# Patient Record
Sex: Female | Born: 1965 | Race: Black or African American | Hispanic: No | Marital: Single | Smoking: Never smoker
Health system: Southern US, Community
[De-identification: ages and names within clinical notes are randomized; demographics above are authoritative.]

## PROBLEM LIST (undated history)

## (undated) DIAGNOSIS — J45909 Unspecified asthma, uncomplicated: Secondary | ICD-10-CM

## (undated) DIAGNOSIS — E7800 Pure hypercholesterolemia, unspecified: Secondary | ICD-10-CM

## (undated) DIAGNOSIS — E669 Obesity, unspecified: Secondary | ICD-10-CM

## (undated) DIAGNOSIS — E119 Type 2 diabetes mellitus without complications: Secondary | ICD-10-CM

## (undated) DIAGNOSIS — I10 Essential (primary) hypertension: Secondary | ICD-10-CM

## (undated) HISTORY — PX: IR REMOVAL OF CALCULI/DEBRIS BILIARY DUCT/GB: IMG6054

---

## 2011-12-21 ENCOUNTER — Other Ambulatory Visit: Payer: Self-pay | Admitting: Internal Medicine

## 2011-12-21 DIAGNOSIS — Z1231 Encounter for screening mammogram for malignant neoplasm of breast: Secondary | ICD-10-CM

## 2012-02-01 ENCOUNTER — Ambulatory Visit
Admission: RE | Admit: 2012-02-01 | Discharge: 2012-02-01 | Disposition: A | Discharge status: Home / Self Care | Payer: Medicaid Other | Source: Ambulatory Visit | Attending: Internal Medicine | Admitting: Internal Medicine

## 2012-02-01 DIAGNOSIS — Z1231 Encounter for screening mammogram for malignant neoplasm of breast: Secondary | ICD-10-CM

## 2012-04-12 ENCOUNTER — Encounter: Payer: Self-pay | Admitting: Obstetrics

## 2012-06-20 ENCOUNTER — Ambulatory Visit: Payer: Self-pay | Admitting: Obstetrics

## 2012-06-25 ENCOUNTER — Emergency Department (HOSPITAL_COMMUNITY)
Admission: EM | Admit: 2012-06-25 | Discharge: 2012-06-26 | Disposition: A | Discharge status: Home / Self Care | Payer: Medicaid Other | Attending: Emergency Medicine | Admitting: Emergency Medicine

## 2012-06-25 ENCOUNTER — Emergency Department (HOSPITAL_COMMUNITY): Payer: Medicaid Other

## 2012-06-25 ENCOUNTER — Encounter (HOSPITAL_COMMUNITY): Payer: Self-pay | Admitting: Emergency Medicine

## 2012-06-25 DIAGNOSIS — J45909 Unspecified asthma, uncomplicated: Secondary | ICD-10-CM | POA: Insufficient documentation

## 2012-06-25 DIAGNOSIS — E7800 Pure hypercholesterolemia, unspecified: Secondary | ICD-10-CM | POA: Insufficient documentation

## 2012-06-25 DIAGNOSIS — E119 Type 2 diabetes mellitus without complications: Secondary | ICD-10-CM | POA: Insufficient documentation

## 2012-06-25 DIAGNOSIS — E669 Obesity, unspecified: Secondary | ICD-10-CM | POA: Insufficient documentation

## 2012-06-25 DIAGNOSIS — J45901 Unspecified asthma with (acute) exacerbation: Secondary | ICD-10-CM | POA: Insufficient documentation

## 2012-06-25 DIAGNOSIS — R079 Chest pain, unspecified: Secondary | ICD-10-CM

## 2012-06-25 DIAGNOSIS — I10 Essential (primary) hypertension: Secondary | ICD-10-CM | POA: Insufficient documentation

## 2012-06-25 HISTORY — DX: Obesity, unspecified: E66.9

## 2012-06-25 HISTORY — DX: Type 2 diabetes mellitus without complications: E11.9

## 2012-06-25 HISTORY — DX: Essential (primary) hypertension: I10

## 2012-06-25 HISTORY — DX: Pure hypercholesterolemia, unspecified: E78.00

## 2012-06-25 HISTORY — DX: Unspecified asthma, uncomplicated: J45.909

## 2012-06-25 LAB — BASIC METABOLIC PANEL
BUN: 15 mg/dL (ref 6–23)
Calcium: 9.7 mg/dL (ref 8.4–10.5)
Chloride: 104 mEq/L (ref 96–112)
Creatinine, Ser: 0.93 mg/dL (ref 0.50–1.10)
GFR calc Af Amer: 83 mL/min — ABNORMAL LOW (ref >90)

## 2012-06-25 LAB — CBC
HCT: 36.9 % (ref 36.0–46.0)
MCH: 25.8 pg — ABNORMAL LOW (ref 26.0–34.0)
MCHC: 32.5 g/dL (ref 30.0–36.0)
MCV: 79.2 fL (ref 78.0–100.0)
RDW: 13.6 % (ref 11.5–15.5)
WBC: 8.3 10*3/uL (ref 4.0–10.5)

## 2012-06-25 MED ORDER — ALBUTEROL SULFATE (5 MG/ML) 0.5% IN NEBU
5.0000 mg | INHALATION_SOLUTION | Freq: Once | RESPIRATORY_TRACT | Status: AC
  Administered 2012-06-25: 5 mg via RESPIRATORY_TRACT
  Filled 2012-06-25: qty 1

## 2012-06-25 MED ORDER — IPRATROPIUM BROMIDE 0.02 % IN SOLN
0.5000 mg | Freq: Once | RESPIRATORY_TRACT | Status: AC
  Administered 2012-06-25: 0.5 mg via RESPIRATORY_TRACT
  Filled 2012-06-25: qty 2.5

## 2012-06-25 MED ORDER — ACETAMINOPHEN 500 MG PO TABS
1000.0000 mg | ORAL_TABLET | Freq: Once | ORAL | Status: AC
  Administered 2012-06-25: 1000 mg via ORAL
  Filled 2012-06-25: qty 2

## 2012-06-25 MED ORDER — NITROGLYCERIN 0.4 MG SL SUBL
0.4000 mg | SUBLINGUAL_TABLET | SUBLINGUAL | Status: DC | PRN
  Administered 2012-06-25 (×2): 0.4 mg via SUBLINGUAL
  Filled 2012-06-25: qty 25

## 2012-06-25 MED ORDER — ASPIRIN 325 MG PO TABS
325.0000 mg | ORAL_TABLET | ORAL | Status: AC
  Administered 2012-06-25: 325 mg via ORAL
  Filled 2012-06-25: qty 1

## 2012-06-25 NOTE — ED Provider Notes (Signed)
History     CSN: [id number]  Arrival date & time 06/25/12  2137   First MD Initiated Contact with Patient 06/25/12 2303      Chief Complaint  Patient presents with  . Chest Pain    (Consider location/radiation/quality/duration/timing/severity/associated sxs/prior treatment) HPI This is a 47 year old female who reports a several day history of shortness of breath. This is worse with exertion. She has had chest pain since about 5 PM. The chest pain was sharp and central in her chest radiating to her back. The onset was at rest and, unlike her shortness of breath, did not change with exertion or rest. She describes the severity of her pain as "bad" at its worst and milder now. She was administered sublingual nitroglycerin earlier; that was equivocal change with the nitroglycerin but it did give her a headache. For shortness of breath has been associated with a cough. She is equivocal about whether the chest pain changes with deep breathing. She denies fever, nausea, vomiting, diarrhea or diaphoresis. She states she has an albuterol inhaler but it has not adequately relieved her shortness of breath.  Past Medical History  Diagnosis Date  . Diabetes mellitus without complication   . Hypertension   . Asthma   . Obesity   . Hypercholesterolemia     Past Surgical History  Procedure Laterality Date  . Cesarean section      No family history on file.  History  Substance Use Topics  . Smoking status: Never Smoker   . Smokeless tobacco: Not on file  . Alcohol Use: No    OB History   Grav Para Term Preterm Abortions TAB SAB Ect Mult Living                  Review of Systems  All other systems reviewed and are negative.    Allergies  Review of patient's allergies indicates no known allergies.  Home Medications  No current outpatient prescriptions on file.  BP 154/84  Pulse 77  Temp(Src) 98.2 F (36.8 C) (Oral)  Resp 18  SpO2 100%  LMP 05/22/2012  Physical  Exam General: Well-developed, well-nourished female in no acute distress; appearance consistent with age of record HENT: normocephalic, atraumatic Eyes: pupils equal round and reactive to light; extraocular muscles intact Neck: supple Heart: regular rate and rhythm; no murmurs, rubs or gallops Lungs: Decreased air movement bilaterally; shallow breaths; dry cough on attempted deep breathing Abdomen: soft; nondistended; nontender; no masses or hepatosplenomegaly; bowel sounds present Extremities: No deformity; full range of motion; pulses normal; no edema Neurologic: Awake, alert and oriented; motor function intact in all extremities and symmetric; no facial droop Skin: Warm and dry Psychiatric: Flat affect    ED Course  Procedures (including critical care time)    MDM   Nursing notes and vitals signs, including pulse oximetry, reviewed.  Summary of this visit's results, reviewed by myself:  Labs:  Results for orders placed during the hospital encounter of 06/25/12 (from the past 24 hour(s))  CBC     Status: Abnormal   Collection Time    06/25/12  9:53 PM      Result Value Range   WBC 8.3  4.0 - 10.5 K/uL   RBC 4.66  3.87 - 5.11 MIL/uL   Hemoglobin 12.0  12.0 - 15.0 g/dL   HCT 08.6  57.8 - 46.9 %   MCV 79.2  78.0 - 100.0 fL   MCH 25.8 (*) 26.0 - 34.0 pg   MCHC 32.5  30.0 - 36.0 g/dL   RDW 96.0  45.4 - 09.8 %   Platelets 340  150 - 400 K/uL  BASIC METABOLIC PANEL     Status: Abnormal   Collection Time    06/25/12  9:53 PM      Result Value Range   Sodium 139  135 - 145 mEq/L   Potassium 3.4 (*) 3.5 - 5.1 mEq/L   Chloride 104  96 - 112 mEq/L   CO2 26  19 - 32 mEq/L   Glucose, Bld 143 (*) 70 - 99 mg/dL   BUN 15  6 - 23 mg/dL   Creatinine, Ser 1.19  0.50 - 1.10 mg/dL   Calcium 9.7  8.4 - 14.7 mg/dL   GFR calc non Af Amer 72 (*) >90 mL/min   GFR calc Af Amer 83 (*) >90 mL/min  PRO B NATRIURETIC PEPTIDE     Status: None   Collection Time    06/25/12  9:53 PM       Result Value Range   Pro B Natriuretic peptide (BNP) 70.1  0 - 125 pg/mL  POCT I-STAT TROPONIN I     Status: None   Collection Time    06/25/12  9:58 PM      Result Value Range   Troponin i, poc 0.00  0.00 - 0.08 ng/mL   Comment 3           POCT I-STAT TROPONIN I     Status: None   Collection Time    06/26/12  1:15 AM      Result Value Range   Troponin i, poc 0.01  0.00 - 0.08 ng/mL   Comment 3           POCT I-STAT TROPONIN I     Status: None   Collection Time    06/26/12  4:49 AM      Result Value Range   Troponin i, poc 0.01  0.00 - 0.08 ng/mL   Comment 3             Imaging Studies: Dg Chest 2 View  06/25/2012   *RADIOLOGY REPORT*  Clinical Data: Chest pain and shortness of breath.  CHEST - 2 VIEW  Comparison: No priors.  Findings: Lung volumes are very low.  No consolidative airspace disease.  No pleural effusions.  No pneumothorax.  No pulmonary nodule or mass noted.  Pulmonary vasculature and the cardiomediastinal silhouette are within normal limits.  IMPRESSION: 1.  Very low lung volumes without radiographic evidence of acute cardiopulmonary disease.   Original Report Authenticated By: Trudie Reed, M.D.   EKG Interpretation:  Date & Time: 06/25/2012 9:45 PM  Rate: 82  Rhythm: normal sinus rhythm  QRS Axis: normal  Intervals: normal  ST/T Wave abnormalities: nonspecific ST/T changes  Conduction Disutrbances:none  Narrative Interpretation: [facility]  Old EKG Reviewed: none available  2:06 AM Patient is pain-free. Air movement improved with no wheezing after neb treatment. 2 troponins have been negative.  5:21 AM Patient is pain-free at this time. 3 troponins have been negative. She states her breathing continues to be improved, lungs clear.         Hanley Seamen, MD 06/26/12 [phone]

## 2012-06-25 NOTE — ED Notes (Signed)
PT. REPORTS MID CHEST PAIN RADIATING TO MID BACK ONSET TODAY WITH SOB AND DRY COUGH , DENIES NAUSEA OR DIAPHORESIS.

## 2012-06-26 LAB — POCT I-STAT TROPONIN I: Troponin i, poc: 0.01 ng/mL (ref 0.00–0.08)

## 2012-06-26 MED ORDER — ONDANSETRON 4 MG PO TBDP: 4.0000 mg | ORAL_TABLET | Freq: Once | ORAL | Status: DC

## 2012-06-26 MED ORDER — FENTANYL CITRATE 0.05 MG/ML IJ SOLN: 100.0000 ug | Freq: Once | INTRAMUSCULAR | Status: DC

## 2012-06-26 NOTE — ED Notes (Signed)
IV removed, cath in tact

## 2012-08-08 ENCOUNTER — Ambulatory Visit: Payer: Medicaid Other | Admitting: Obstetrics & Gynecology

## 2012-08-08 ENCOUNTER — Ambulatory Visit: Payer: Medicaid Other | Admitting: Obstetrics

## 2012-08-28 ENCOUNTER — Encounter (HOSPITAL_COMMUNITY): Payer: Self-pay | Admitting: *Deleted

## 2012-08-28 ENCOUNTER — Emergency Department (HOSPITAL_COMMUNITY)
Admission: EM | Admit: 2012-08-28 | Discharge: 2012-08-28 | Disposition: A | Discharge status: Home / Self Care | Payer: Medicaid Other | Attending: Emergency Medicine | Admitting: Emergency Medicine

## 2012-08-28 DIAGNOSIS — Z862 Personal history of diseases of the blood and blood-forming organs and certain disorders involving the immune mechanism: Secondary | ICD-10-CM | POA: Insufficient documentation

## 2012-08-28 DIAGNOSIS — J45909 Unspecified asthma, uncomplicated: Secondary | ICD-10-CM | POA: Insufficient documentation

## 2012-08-28 DIAGNOSIS — Z8639 Personal history of other endocrine, nutritional and metabolic disease: Secondary | ICD-10-CM | POA: Insufficient documentation

## 2012-08-28 DIAGNOSIS — E669 Obesity, unspecified: Secondary | ICD-10-CM | POA: Insufficient documentation

## 2012-08-28 DIAGNOSIS — I10 Essential (primary) hypertension: Secondary | ICD-10-CM | POA: Insufficient documentation

## 2012-08-28 DIAGNOSIS — M62838 Other muscle spasm: Secondary | ICD-10-CM | POA: Insufficient documentation

## 2012-08-28 DIAGNOSIS — M62830 Muscle spasm of back: Secondary | ICD-10-CM

## 2012-08-28 DIAGNOSIS — E119 Type 2 diabetes mellitus without complications: Secondary | ICD-10-CM | POA: Insufficient documentation

## 2012-08-28 MED ORDER — METHOCARBAMOL 500 MG PO TABS: 500.0000 mg | ORAL_TABLET | Freq: Two times a day (BID) | ORAL | Status: DC | PRN

## 2012-08-28 MED ORDER — METHOCARBAMOL 500 MG PO TABS
500.0000 mg | ORAL_TABLET | Freq: Once | ORAL | Status: AC
  Administered 2012-08-28: 500 mg via ORAL
  Filled 2012-08-28: qty 1

## 2012-08-28 NOTE — ED Notes (Signed)
BP results reported to PA.

## 2012-08-28 NOTE — ED Notes (Signed)
Pt reports lower back pain since Friday, unsure if she injured it when cleaning. No relief with ibuprofen and vicodin. Ambulatory at triage.

## 2012-08-28 NOTE — ED Notes (Signed)
Pt states she did "sit ups" on Thursday then did "heavy housework" on Friday.Lower back pain started on Friday.

## 2012-08-28 NOTE — ED Provider Notes (Signed)
Medical screening examination/treatment/procedure(s) were performed by non-physician practitioner and as supervising physician I was immediately available for consultation/collaboration.   Glynn Octave, MD 08/28/12 [phone]

## 2012-08-28 NOTE — ED Provider Notes (Signed)
CSN: [id number]     Arrival date & time 08/28/12  1340 History     None    Chief Complaint  Patient presents with  . Back Pain   (Consider location/radiation/quality/duration/timing/severity/associated sxs/prior Treatment) The history is provided by the patient and medical records.   Patient presents to the ED low back pain x5 days. Patient states last week she attempted to do sit-ups at home, and clean her entire house the next day.  Has had some "soreness" in her low back ever since, worse with bending forward and twisting motions.  No radiation into extremities.  Denies any numbness or paresthesias of lower extremities. No loss of bowel or bladder function. No prior history of back injury or back surgery. Has taken over-the-counter ibuprofen and Vicodin without significant relief.  No chest pain, SOB, or abdominal pain.  Pt hypertensive on arrival-- has been off her BP meds for several months.  Past Medical History  Diagnosis Date  . Diabetes mellitus without complication   . Hypertension   . Asthma   . Obesity   . Hypercholesterolemia    Past Surgical History  Procedure Laterality Date  . Cesarean section     History reviewed. No pertinent family history. History  Substance Use Topics  . Smoking status: Never Smoker   . Smokeless tobacco: Not on file  . Alcohol Use: No   OB History   Grav Para Term Preterm Abortions TAB SAB Ect Mult Living                 Review of Systems  Musculoskeletal: Positive for back pain.  All other systems reviewed and are negative.    Allergies  Review of patient's allergies indicates no known allergies.  Home Medications  No current outpatient prescriptions on file. BP 181/114  Temp(Src) 98.1 F (36.7 C) (Oral)  Resp 18  SpO2 95%  Physical Exam  Nursing note and vitals reviewed. Constitutional: She is oriented to person, place, and time. She appears well-developed and well-nourished. No distress.  HENT:  Head: Normocephalic  and atraumatic.  Eyes: Conjunctivae and EOM are normal. Pupils are equal, round, and reactive to light.  Neck: Normal range of motion. Neck supple.  Cardiovascular: Normal rate, regular rhythm and normal heart sounds.   Pulmonary/Chest: Effort normal and breath sounds normal. No respiratory distress. She has no wheezes.  Musculoskeletal: Normal range of motion.       Lumbar back: She exhibits tenderness, pain and spasm. She exhibits normal range of motion, no bony tenderness, no swelling, no edema, no deformity and no laceration.       Back:  TTP of lumbar paraspinal muscles bilaterally; no midline TTP, step-off, deformity, or visible signs of trauma; full ROM maintained; strong distal pulse,  sensation intact, normal gait unassisted  Neurological: She is alert and oriented to person, place, and time.  Skin: Skin is warm and dry. She is not diaphoretic.  Psychiatric: She has a normal mood and affect.    ED Course   Procedures (including critical care time)  Labs Reviewed - No data to display No results found.  1. Muscle spasm of back     MDM   Pt ambulated unassisted throughout the ED several times without difficulty.  Muscle spasms of LS bilaterally.  No concern for cauda equina or aortic dissection.  Rx robaxin.  Pt remains hypertensive-- instructed to FU with PCP to get back on her BP meds.  Discussed plan with pt, she agreed.  Return precautions  advised.  Garlon Hatchet, PA-C 08/28/12 1517

## 2012-09-11 ENCOUNTER — Ambulatory Visit (INDEPENDENT_AMBULATORY_CARE_PROVIDER_SITE_OTHER): Payer: Medicaid Other | Admitting: Obstetrics

## 2012-09-11 ENCOUNTER — Encounter: Payer: Self-pay | Admitting: Obstetrics

## 2012-09-11 VITALS — BP 130/99 | HR 94 | Temp 98.0°F | Wt 205.0 lb

## 2012-09-11 DIAGNOSIS — Z113 Encounter for screening for infections with a predominantly sexual mode of transmission: Secondary | ICD-10-CM

## 2012-09-11 DIAGNOSIS — Z0000 Encounter for general adult medical examination without abnormal findings: Secondary | ICD-10-CM

## 2012-09-11 NOTE — Addendum Note (Signed)
Addended by: Glendell Docker on: 09/11/2012 04:44 PM   Modules accepted: Orders

## 2012-09-11 NOTE — Progress Notes (Signed)
Subjective:     Leslie Wood is a 47 y.o. female here for a routine exam.  Current complaints: annual exam. No concerns at this time.  Personal health questionnaire reviewed: yes.   Gynecologic History No LMP recorded. Contraception: abstinence Last Pap: 2013 Results were: normal Last mammogram: 01/2012. Results were: normal  Obstetric History OB History  No data available     The following portions of the patient's history were reviewed and updated as appropriate: allergies, current medications, past family history, past medical history, past social history, past surgical history and problem list.  Review of Systems Pertinent items are noted in HPI.    Objective:    General appearance: alert and no distress Breasts: normal appearance, no masses or tenderness Abdomen: normal findings: soft, non-tender Pelvic: cervix normal in appearance, external genitalia normal, no adnexal masses or tenderness, no cervical motion tenderness, uterus normal size, shape, and consistency and vagina normal without discharge Extremities: extremities normal, atraumatic, no cyanosis or edema    Assessment:    Healthy female exam.    Plan:    Follow up in: 1 year.

## 2012-09-12 LAB — WET PREP BY MOLECULAR PROBE: Candida species: NEGATIVE

## 2012-09-12 LAB — GC/CHLAMYDIA PROBE AMP: CT Probe RNA: NEGATIVE

## 2012-09-12 LAB — PAP IG W/ RFLX HPV ASCU

## 2013-01-14 ENCOUNTER — Other Ambulatory Visit: Payer: Self-pay

## 2013-01-14 DIAGNOSIS — Z1231 Encounter for screening mammogram for malignant neoplasm of breast: Secondary | ICD-10-CM

## 2013-02-04 ENCOUNTER — Ambulatory Visit: Payer: Medicaid Other

## 2013-02-19 ENCOUNTER — Other Ambulatory Visit: Payer: Self-pay

## 2013-02-19 ENCOUNTER — Ambulatory Visit
Admission: RE | Admit: 2013-02-19 | Discharge: 2013-02-19 | Disposition: A | Discharge status: Home / Self Care | Payer: Medicaid Other | Source: Ambulatory Visit

## 2013-02-19 DIAGNOSIS — Z1231 Encounter for screening mammogram for malignant neoplasm of breast: Secondary | ICD-10-CM

## 2013-07-06 ENCOUNTER — Emergency Department (HOSPITAL_COMMUNITY)
Admission: EM | Admit: 2013-07-06 | Discharge: 2013-07-06 | Disposition: A | Discharge status: Home / Self Care | Payer: Medicaid Other | Attending: Emergency Medicine | Admitting: Emergency Medicine

## 2013-07-06 ENCOUNTER — Encounter (HOSPITAL_COMMUNITY): Payer: Self-pay | Admitting: Emergency Medicine

## 2013-07-06 DIAGNOSIS — J45909 Unspecified asthma, uncomplicated: Secondary | ICD-10-CM | POA: Insufficient documentation

## 2013-07-06 DIAGNOSIS — E669 Obesity, unspecified: Secondary | ICD-10-CM | POA: Insufficient documentation

## 2013-07-06 DIAGNOSIS — E7800 Pure hypercholesterolemia, unspecified: Secondary | ICD-10-CM | POA: Insufficient documentation

## 2013-07-06 DIAGNOSIS — Z79899 Other long term (current) drug therapy: Secondary | ICD-10-CM | POA: Insufficient documentation

## 2013-07-06 DIAGNOSIS — R112 Nausea with vomiting, unspecified: Secondary | ICD-10-CM | POA: Insufficient documentation

## 2013-07-06 DIAGNOSIS — I10 Essential (primary) hypertension: Secondary | ICD-10-CM | POA: Insufficient documentation

## 2013-07-06 DIAGNOSIS — E119 Type 2 diabetes mellitus without complications: Secondary | ICD-10-CM | POA: Insufficient documentation

## 2013-07-06 LAB — CBC WITH DIFFERENTIAL/PLATELET
BASOS PCT: 0 % (ref 0–1)
Basophils Absolute: 0 10*3/uL (ref 0.0–0.1)
EOS ABS: 0.2 10*3/uL (ref 0.0–0.7)
EOS PCT: 2 % (ref 0–5)
HCT: 37.4 % (ref 36.0–46.0)
Hemoglobin: 11.7 g/dL — ABNORMAL LOW (ref 12.0–15.0)
LYMPHS ABS: 3 10*3/uL (ref 0.7–4.0)
Lymphocytes Relative: 37 % (ref 12–46)
MCH: 25.9 pg — AB (ref 26.0–34.0)
MCHC: 31.3 g/dL (ref 30.0–36.0)
MCV: 82.7 fL (ref 78.0–100.0)
MONOS PCT: 8 % (ref 3–12)
Monocytes Absolute: 0.6 10*3/uL (ref 0.1–1.0)
Neutro Abs: 4.3 10*3/uL (ref 1.7–7.7)
Neutrophils Relative %: 53 % (ref 43–77)
Platelets: 285 10*3/uL (ref 150–400)
RBC: 4.52 MIL/uL (ref 3.87–5.11)
RDW: 13.9 % (ref 11.5–15.5)
WBC: 8.2 10*3/uL (ref 4.0–10.5)

## 2013-07-06 LAB — CBG MONITORING, ED
Glucose-Capillary: 189 mg/dL — ABNORMAL HIGH (ref 70–99)
Glucose-Capillary: 139 mg/dL — ABNORMAL HIGH (ref 70–99)

## 2013-07-06 LAB — COMPREHENSIVE METABOLIC PANEL
ALK PHOS: 93 U/L (ref 39–117)
ALT: 19 U/L (ref 0–35)
AST: 28 U/L (ref 0–37)
Albumin: 3.4 g/dL — ABNORMAL LOW (ref 3.5–5.2)
BUN: 18 mg/dL (ref 6–23)
CO2: 26 mEq/L (ref 19–32)
Calcium: 8.9 mg/dL (ref 8.4–10.5)
Chloride: 95 mEq/L — ABNORMAL LOW (ref 96–112)
Creatinine, Ser: 0.99 mg/dL (ref 0.50–1.10)
GFR calc non Af Amer: 66 mL/min — ABNORMAL LOW (ref >90)
GFR, EST AFRICAN AMERICAN: 76 mL/min — AB (ref >90)
GLUCOSE: 205 mg/dL — AB (ref 70–99)
Potassium: 3.8 mEq/L (ref 3.7–5.3)
Sodium: 136 mEq/L — ABNORMAL LOW (ref 137–147)
TOTAL PROTEIN: 7.8 g/dL (ref 6.0–8.3)
Total Bilirubin: <0.2 mg/dL — ABNORMAL LOW (ref 0.3–1.2)

## 2013-07-06 MED ORDER — OXYCODONE-ACETAMINOPHEN 5-325 MG PO TABS
1.0000 | ORAL_TABLET | Freq: Once | ORAL | Status: AC
  Administered 2013-07-06: 1 via ORAL
  Filled 2013-07-06: qty 1

## 2013-07-06 MED ORDER — ONDANSETRON HCL 4 MG PO TABS: 4.0000 mg | ORAL_TABLET | Freq: Four times a day (QID) | ORAL | Status: AC | PRN

## 2013-07-06 MED ORDER — SODIUM CHLORIDE 0.9 % IV SOLN
1000.0000 mL | Freq: Once | INTRAVENOUS | Status: AC
  Administered 2013-07-06: 1000 mL via INTRAVENOUS

## 2013-07-06 MED ORDER — ONDANSETRON HCL 4 MG/2ML IJ SOLN
4.0000 mg | Freq: Once | INTRAMUSCULAR | Status: AC
  Administered 2013-07-06: 4 mg via INTRAVENOUS
  Filled 2013-07-06: qty 2

## 2013-07-06 MED ORDER — ONDANSETRON 4 MG PO TBDP
8.0000 mg | ORAL_TABLET | Freq: Once | ORAL | Status: AC
  Administered 2013-07-06: 8 mg via ORAL

## 2013-07-06 MED ORDER — SODIUM CHLORIDE 0.9 % IV SOLN
1000.0000 mL | INTRAVENOUS | Status: DC
  Administered 2013-07-06: 1000 mL via INTRAVENOUS

## 2013-07-06 MED ORDER — ONDANSETRON 4 MG PO TBDP
ORAL_TABLET | ORAL | Status: AC
  Filled 2013-07-06: qty 2

## 2013-07-06 NOTE — ED Provider Notes (Signed)
CSN: [id number]     Arrival date & time 07/06/13  0049 History   First MD Initiated Contact with Patient 07/06/13 0222     Chief Complaint  Patient presents with  . Nausea     (Consider location/radiation/quality/duration/timing/severity/associated sxs/prior Treatment) The history is provided by the patient.   48 year old female who is diabetic comes in with a two-day history of nausea. She vomited once yesterday and once today. She denies fever, chills, sweats. She denies abdominal pain. She denies constipation or diarrhea. Symptoms are severe. Nothing makes it better nothing makes it worse. She's also noted that her blood sugars have been higher than normal. Highest blood sugar was 288. She has not taken any treatment for this at home. She denies any sick contacts.  Past Medical History  Diagnosis Date  . Diabetes mellitus without complication   . Hypertension   . Asthma   . Obesity   . Hypercholesterolemia    Past Surgical History  Procedure Laterality Date  . Cesarean section     No family history on file. History  Substance Use Topics  . Smoking status: Never Smoker   . Smokeless tobacco: Not on file  . Alcohol Use: No   OB History   Grav Para Term Preterm Abortions TAB SAB Ect Mult Living                 Review of Systems  All other systems reviewed and are negative.     Allergies  Review of patient's allergies indicates no known allergies.  Home Medications   Prior to Admission medications   Medication Sig Start Date End Date Taking? Authorizing Provider  glimepiride (AMARYL) 4 MG tablet Take 4 mg by mouth daily before breakfast.    Historical Provider, MD  hydrochlorothiazide (HYDRODIURIL) 25 MG tablet Take 25 mg by mouth daily.    Historical Provider, MD  Hydrocodone-Acetaminophen (VICODIN PO) Take 1 tablet by mouth daily as needed (pain).    Historical Provider, MD  ibuprofen (ADVIL,MOTRIN) 200 MG tablet Take 200 mg by mouth every 6 (six) hours as needed  for pain.    Historical Provider, MD  losartan (COZAAR) 100 MG tablet Take 100 mg by mouth daily.    Historical Provider, MD  methocarbamol (ROBAXIN) 500 MG tablet Take 1 tablet (500 mg total) by mouth 2 (two) times daily as needed. 08/28/12   Garlon HatchetLisa M Sanders, PA-C  pravastatin (PRAVACHOL) 20 MG tablet Take 20 mg by mouth daily.    Historical Provider, MD  Prenatal Vit-Fe Fumarate-FA (PRENATAL COMPLETE PO) Take 1 tablet by mouth daily.    Historical Provider, MD   BP 115/88  Pulse 116  Temp(Src) 98.6 F (37 C) (Oral)  Resp 20  Wt 214 lb (97.07 kg)  SpO2 94% Physical Exam  Nursing note and vitals reviewed.  48 year old female, resting comfortably and in no acute distress. Vital signs are significant for tachycardia with heart rate 116. Oxygen saturation is 94%, which is normal. Head is normocephalic and atraumatic. PERRLA, EOMI. Oropharynx is clear. Neck is nontender and supple without adenopathy or JVD. Back is nontender and there is no CVA tenderness. Lungs are clear without rales, wheezes, or rhonchi. Chest is nontender. Heart has regular rate and rhythm without murmur. Abdomen is soft, flat, nontender without masses or hepatosplenomegaly and peristalsis is normoactive. Extremities have no cyanosis or edema, full range of motion is present. Skin is warm and dry without rash. Neurologic: Mental status is normal, cranial nerves are intact,  there are no motor or sensory deficits.  ED Course  Procedures (including critical care time) Results for orders placed during the hospital encounter of 07/06/13  CBC WITH DIFFERENTIAL      Result Value Ref Range   WBC 8.2  4.0 - 10.5 K/uL   RBC 4.52  3.87 - 5.11 MIL/uL   Hemoglobin 11.7 (*) 12.0 - 15.0 g/dL   HCT 16.137.4  09.636.0 - 04.546.0 %   MCV 82.7  78.0 - 100.0 fL   MCH 25.9 (*) 26.0 - 34.0 pg   MCHC 31.3  30.0 - 36.0 g/dL   RDW 40.913.9  81.111.5 - 91.415.5 %   Platelets 285  150 - 400 K/uL   Neutrophils Relative % 53  43 - 77 %   Neutro Abs 4.3  1.7 -  7.7 K/uL   Lymphocytes Relative 37  12 - 46 %   Lymphs Abs 3.0  0.7 - 4.0 K/uL   Monocytes Relative 8  3 - 12 %   Monocytes Absolute 0.6  0.1 - 1.0 K/uL   Eosinophils Relative 2  0 - 5 %   Eosinophils Absolute 0.2  0.0 - 0.7 K/uL   Basophils Relative 0  0 - 1 %   Basophils Absolute 0.0  0.0 - 0.1 K/uL  COMPREHENSIVE METABOLIC PANEL      Result Value Ref Range   Sodium 136 (*) 137 - 147 mEq/L   Potassium 3.8  3.7 - 5.3 mEq/L   Chloride 95 (*) 96 - 112 mEq/L   CO2 26  19 - 32 mEq/L   Glucose, Bld 205 (*) 70 - 99 mg/dL   BUN 18  6 - 23 mg/dL   Creatinine, Ser 7.820.99  0.50 - 1.10 mg/dL   Calcium 8.9  8.4 - 95.610.5 mg/dL   Total Protein 7.8  6.0 - 8.3 g/dL   Albumin 3.4 (*) 3.5 - 5.2 g/dL   AST 28  0 - 37 U/L   ALT 19  0 - 35 U/L   Alkaline Phosphatase 93  39 - 117 U/L   Total Bilirubin <0.2 (*) 0.3 - 1.2 mg/dL   GFR calc non Af Amer 66 (*) >90 mL/min   GFR calc Af Amer 76 (*) >90 mL/min  CBG MONITORING, ED      Result Value Ref Range   Glucose-Capillary 189 (*) 70 - 99 mg/dL   MDM   Final diagnoses:  Non-intractable vomiting with nausea, vomiting of unspecified type    Nausea with intermittent vomiting. This seems most likely to be a viral gastritis. Tachycardia likely represents some degree of dehydration. She'll be given IV fluids and IV ondansetron. Screening labs are obtained.  She feels much better after above noted treatment. She is discharged with prescription for ondansetron.  Dione Boozeavid Glick, MD 07/06/13 [phone]

## 2013-07-06 NOTE — Discharge Instructions (Signed)
Nausea and Vomiting °Nausea is a sick feeling that often comes before throwing up (vomiting). Vomiting is a reflex where stomach contents come out of your mouth. Vomiting can cause severe loss of body fluids (dehydration). Children and elderly adults can become dehydrated quickly, especially if they also have diarrhea. Nausea and vomiting are symptoms of a condition or disease. It is important to find the cause of your symptoms. °CAUSES  °· Direct irritation of the stomach lining. This irritation can result from increased acid production (gastroesophageal reflux disease), infection, food poisoning, taking certain medicines (such as nonsteroidal anti-inflammatory drugs), alcohol use, or tobacco use. °· Signals from the brain. These signals could be caused by a headache, heat exposure, an inner ear disturbance, increased pressure in the brain from injury, infection, a tumor, or a concussion, pain, emotional stimulus, or metabolic problems. °· An obstruction in the gastrointestinal tract (bowel obstruction). °· Illnesses such as diabetes, hepatitis, gallbladder problems, appendicitis, kidney problems, cancer, sepsis, atypical symptoms of a heart attack, or eating disorders. °· Medical treatments such as chemotherapy and radiation. °· Receiving medicine that makes you sleep (general anesthetic) during surgery. °DIAGNOSIS °Your caregiver may ask for tests to be done if the problems do not improve after a few days. Tests may also be done if symptoms are severe or if the reason for the nausea and vomiting is not clear. Tests may include: °· Urine tests. °· Blood tests. °· Stool tests. °· Cultures (to look for evidence of infection). °· X-rays or other imaging studies. °Test results can help your caregiver make decisions about treatment or the need for additional tests. °TREATMENT °You need to stay well hydrated. Drink frequently but in small amounts. You may wish to drink water, sports drinks, clear broth, or eat frozen  ice pops or gelatin dessert to help stay hydrated. When you eat, eating slowly may help prevent nausea. There are also some antinausea medicines that may help prevent nausea. °HOME CARE INSTRUCTIONS  °· Take all medicine as directed by your caregiver. °· If you do not have an appetite, do not force yourself to eat. However, you must continue to drink fluids. °· If you have an appetite, eat a normal diet unless your caregiver tells you differently. °¨ Eat a variety of complex carbohydrates (rice, wheat, potatoes, bread), lean meats, yogurt, fruits, and vegetables. °¨ Avoid high-fat foods because they are more difficult to digest. °· Drink enough water and fluids to keep your urine clear or pale yellow. °· If you are dehydrated, ask your caregiver for specific rehydration instructions. Signs of dehydration may include: °¨ Severe thirst. °¨ Dry lips and mouth. °¨ Dizziness. °¨ Dark urine. °¨ Decreasing urine frequency and amount. °¨ Confusion. °¨ Rapid breathing or pulse. °SEEK IMMEDIATE MEDICAL CARE IF:  °· You have blood or brown flecks (like coffee grounds) in your vomit. °· You have black or bloody stools. °· You have a severe headache or stiff neck. °· You are confused. °· You have severe abdominal pain. °· You have chest pain or trouble breathing. °· You do not urinate at least once every 8 hours. °· You develop cold or clammy skin. °· You continue to vomit for longer than 24 to 48 hours. °· You have a fever. °MAKE SURE YOU:  °· Understand these instructions. °· Will watch your condition. °· Will get help right away if you are not doing well or get worse. °Document Released: 12/26/2004 Document Revised: 03/20/2011 Document Reviewed: 05/25/2010 °[employer]® Patient Information ©2015 [employer]. This information is not intended   to replace advice given to you by your health care provider. Make sure you discuss any questions you have with your health care provider. ° °Ondansetron tablets °What is this  medicine? °ONDANSETRON (on DAN se tron) is used to treat nausea and vomiting caused by chemotherapy. It is also used to prevent or treat nausea and vomiting after surgery. °This medicine may be used for other purposes; ask your health care provider or pharmacist if you have questions. °COMMON BRAND NAME(S): Zofran °What should I tell my health care provider before I take this medicine? °They need to know if you have any of these conditions: °-heart disease °-history of irregular heartbeat °-liver disease °-low levels of magnesium or potassium in the blood °-an unusual or allergic reaction to ondansetron, granisetron, other medicines, foods, dyes, or preservatives °-pregnant or trying to get pregnant °-breast-feeding °How should I use this medicine? °Take this medicine by mouth with a glass of water. Follow the directions on your prescription label. Take your doses at regular intervals. Do not take your medicine more often than directed. °Talk to your pediatrician regarding the use of this medicine in children. Special care may be needed. °Overdosage: If you think you have taken too much of this medicine contact a poison control center or emergency room at once. °NOTE: This medicine is only for you. Do not share this medicine with others. °What if I miss a dose? °If you miss a dose, take it as soon as you can. If it is almost time for your next dose, take only that dose. Do not take double or extra doses. °What may interact with this medicine? °Do not take this medicine with any of the following medications: °-apomorphine °-certain medicines for fungal infections like fluconazole, itraconazole, ketoconazole, posaconazole, voriconazole °-cisapride °-dofetilide °-dronedarone °-pimozide °-thioridazine °-ziprasidone °This medicine may also interact with the following medications: °-carbamazepine °-certain medicines for depression, anxiety, or psychotic disturbances °-fentanyl °-linezolid °-MAOIs like Carbex, Eldepryl,  Marplan, Nardil, and Parnate °-methylene blue (injected into a vein) °-other medicines that prolong the QT interval (cause an abnormal heart rhythm) °-phenytoin °-rifampicin °-tramadol °This list may not describe all possible interactions. Give your health care provider a list of all the medicines, herbs, non-prescription drugs, or dietary supplements you use. Also tell them if you smoke, drink alcohol, or use illegal drugs. Some items may interact with your medicine. °What should I watch for while using this medicine? °Check with your doctor or health care professional right away if you have any sign of an allergic reaction. °What side effects may I notice from receiving this medicine? °Side effects that you should report to your doctor or health care professional as soon as possible: °-allergic reactions like skin rash, itching or hives, swelling of the face, lips or tongue °-breathing problems °-confusion °-dizziness °-fast or irregular heartbeat °-feeling faint or lightheaded, falls °-fever and chills °-loss of balance or coordination °-seizures °-sweating °-swelling of the hands or feet °-tightness in the chest °-tremors °-unusually weak or tired °Side effects that usually do not require medical attention (report to your doctor or health care professional if they continue or are bothersome): °-constipation or diarrhea °-headache °This list may not describe all possible side effects. Call your doctor for medical advice about side effects. You may report side effects to FDA at 1-800-FDA-1088. °Where should I keep my medicine? °Keep out of the reach of children. °Store between 2 and 30 degrees C (36 and 86 degrees F). Throw away any unused medicine after the expiration   date. °NOTE: This sheet is a summary. It may not cover all possible information. If you have questions about this medicine, talk to your doctor, pharmacist, or health care provider. °© 2015, [employer]. (2012-10-02 16:27:45) ° °

## 2013-07-06 NOTE — ED Notes (Signed)
The pt is c/o n v  Since yesterday and she is also c/o a headache

## 2014-02-04 ENCOUNTER — Other Ambulatory Visit: Payer: Self-pay

## 2014-02-04 DIAGNOSIS — Z1231 Encounter for screening mammogram for malignant neoplasm of breast: Secondary | ICD-10-CM

## 2014-02-16 ENCOUNTER — Ambulatory Visit: Payer: Medicaid Other

## 2014-03-02 ENCOUNTER — Ambulatory Visit
Admission: RE | Admit: 2014-03-02 | Discharge: 2014-03-02 | Disposition: A | Discharge status: Home / Self Care | Payer: Medicaid Other | Source: Ambulatory Visit

## 2014-03-02 DIAGNOSIS — Z1231 Encounter for screening mammogram for malignant neoplasm of breast: Secondary | ICD-10-CM

## 2015-02-08 ENCOUNTER — Other Ambulatory Visit: Payer: Self-pay

## 2015-02-08 DIAGNOSIS — Z1231 Encounter for screening mammogram for malignant neoplasm of breast: Secondary | ICD-10-CM

## 2015-03-04 ENCOUNTER — Ambulatory Visit
Admission: RE | Admit: 2015-03-04 | Discharge: 2015-03-04 | Disposition: A | Discharge status: Home / Self Care | Payer: Medicaid Other | Source: Ambulatory Visit

## 2015-03-04 DIAGNOSIS — Z1231 Encounter for screening mammogram for malignant neoplasm of breast: Secondary | ICD-10-CM

## 2016-02-18 ENCOUNTER — Other Ambulatory Visit: Payer: Self-pay | Admitting: Internal Medicine

## 2016-02-18 DIAGNOSIS — Z1231 Encounter for screening mammogram for malignant neoplasm of breast: Secondary | ICD-10-CM

## 2016-03-07 ENCOUNTER — Ambulatory Visit
Admission: RE | Admit: 2016-03-07 | Discharge: 2016-03-07 | Disposition: A | Discharge status: Home / Self Care | Payer: Medicaid Other | Source: Ambulatory Visit | Attending: Internal Medicine | Admitting: Internal Medicine

## 2016-03-07 DIAGNOSIS — Z1231 Encounter for screening mammogram for malignant neoplasm of breast: Secondary | ICD-10-CM

## 2016-09-15 ENCOUNTER — Emergency Department (HOSPITAL_COMMUNITY): Admission: EM | Admit: 2016-09-15 | Discharge: 2016-09-15 | Discharge status: Against Medical Advice | Payer: Self-pay

## 2016-09-15 ENCOUNTER — Emergency Department (HOSPITAL_COMMUNITY): Payer: Medicaid Other

## 2016-09-15 ENCOUNTER — Encounter (HOSPITAL_COMMUNITY): Payer: Self-pay

## 2016-09-15 ENCOUNTER — Emergency Department (HOSPITAL_COMMUNITY)
Admission: EM | Admit: 2016-09-15 | Discharge: 2016-09-16 | Disposition: A | Discharge status: Home / Self Care | Payer: Medicaid Other | Attending: Emergency Medicine | Admitting: Emergency Medicine

## 2016-09-15 DIAGNOSIS — I671 Cerebral aneurysm, nonruptured: Secondary | ICD-10-CM | POA: Diagnosis not present

## 2016-09-15 DIAGNOSIS — Z79899 Other long term (current) drug therapy: Secondary | ICD-10-CM | POA: Insufficient documentation

## 2016-09-15 DIAGNOSIS — R631 Polydipsia: Secondary | ICD-10-CM | POA: Diagnosis not present

## 2016-09-15 DIAGNOSIS — I10 Essential (primary) hypertension: Secondary | ICD-10-CM | POA: Insufficient documentation

## 2016-09-15 DIAGNOSIS — J45909 Unspecified asthma, uncomplicated: Secondary | ICD-10-CM | POA: Insufficient documentation

## 2016-09-15 DIAGNOSIS — E119 Type 2 diabetes mellitus without complications: Secondary | ICD-10-CM | POA: Diagnosis not present

## 2016-09-15 DIAGNOSIS — R358 Other polyuria: Secondary | ICD-10-CM | POA: Diagnosis not present

## 2016-09-15 DIAGNOSIS — R51 Headache: Secondary | ICD-10-CM | POA: Insufficient documentation

## 2016-09-15 DIAGNOSIS — R519 Headache, unspecified: Secondary | ICD-10-CM

## 2016-09-15 LAB — POC URINE PREG, ED: Preg Test, Ur: NEGATIVE

## 2016-09-15 LAB — URINALYSIS, ROUTINE W REFLEX MICROSCOPIC
Bilirubin Urine: NEGATIVE
GLUCOSE, UA: NEGATIVE mg/dL
HGB URINE DIPSTICK: NEGATIVE
Ketones, ur: NEGATIVE mg/dL
LEUKOCYTES UA: NEGATIVE
Nitrite: NEGATIVE
PH: 5 (ref 5.0–8.0)
PROTEIN: NEGATIVE mg/dL
SPECIFIC GRAVITY, URINE: 1.017 (ref 1.005–1.030)

## 2016-09-15 LAB — CBC WITH DIFFERENTIAL/PLATELET
BASOS ABS: 0 10*3/uL (ref 0.0–0.1)
BASOS PCT: 0 %
Eosinophils Absolute: 0.2 10*3/uL (ref 0.0–0.7)
Eosinophils Relative: 2 %
HCT: 40.4 % (ref 36.0–46.0)
Hemoglobin: 13.4 g/dL (ref 12.0–15.0)
Lymphocytes Relative: 10 %
Lymphs Abs: 0.8 10*3/uL (ref 0.7–4.0)
MCH: 27 pg (ref 26.0–34.0)
MCHC: 33.2 g/dL (ref 30.0–36.0)
MCV: 81.5 fL (ref 78.0–100.0)
MONO ABS: 1.3 10*3/uL — AB (ref 0.1–1.0)
Monocytes Relative: 16 %
NEUTROS PCT: 72 %
Neutro Abs: 5.8 10*3/uL (ref 1.7–7.7)
Platelets: 288 10*3/uL (ref 150–400)
RBC: 4.96 MIL/uL (ref 3.87–5.11)
RDW: 13.3 % (ref 11.5–15.5)
WBC: 8 10*3/uL (ref 4.0–10.5)

## 2016-09-15 LAB — COMPREHENSIVE METABOLIC PANEL
ALBUMIN: 3.8 g/dL (ref 3.5–5.0)
ALT: 73 U/L — ABNORMAL HIGH (ref 14–54)
ANION GAP: 8 (ref 5–15)
AST: 68 U/L — ABNORMAL HIGH (ref 15–41)
Alkaline Phosphatase: 78 U/L (ref 38–126)
BUN: 24 mg/dL — ABNORMAL HIGH (ref 6–20)
CO2: 28 mmol/L (ref 22–32)
Calcium: 9.6 mg/dL (ref 8.9–10.3)
Chloride: 101 mmol/L (ref 101–111)
Creatinine, Ser: 0.99 mg/dL (ref 0.44–1.00)
GFR calc Af Amer: >60 mL/min (ref >60)
GFR calc non Af Amer: >60 mL/min (ref >60)
Glucose, Bld: 77 mg/dL (ref 65–99)
Potassium: 4 mmol/L (ref 3.5–5.1)
SODIUM: 137 mmol/L (ref 135–145)
Total Bilirubin: 0.5 mg/dL (ref 0.3–1.2)
Total Protein: 8.4 g/dL — ABNORMAL HIGH (ref 6.5–8.1)

## 2016-09-15 LAB — TROPONIN I: Troponin I: <0.03 ng/mL (ref <0.03)

## 2016-09-15 MED ORDER — KETOROLAC TROMETHAMINE 30 MG/ML IJ SOLN
30.0000 mg | Freq: Once | INTRAMUSCULAR | Status: AC
  Administered 2016-09-15: 30 mg via INTRAVENOUS
  Filled 2016-09-15: qty 1

## 2016-09-15 MED ORDER — MAGNESIUM SULFATE 2 GM/50ML IV SOLN
2.0000 g | Freq: Once | INTRAVENOUS | Status: AC
  Administered 2016-09-16: 2 g via INTRAVENOUS
  Filled 2016-09-15: qty 50

## 2016-09-15 MED ORDER — VALPROATE SODIUM 500 MG/5ML IV SOLN
1.0000 g | Freq: Once | INTRAVENOUS | Status: AC
  Administered 2016-09-16: 1000 mg via INTRAVENOUS
  Filled 2016-09-15: qty 10

## 2016-09-15 MED ORDER — SODIUM CHLORIDE 0.9 % IV BOLUS (SEPSIS)
1000.0000 mL | Freq: Once | INTRAVENOUS | Status: AC
  Administered 2016-09-15: 1000 mL via INTRAVENOUS

## 2016-09-15 MED ORDER — ACETAMINOPHEN 325 MG PO TABS
650.0000 mg | ORAL_TABLET | Freq: Once | ORAL | Status: AC
  Administered 2016-09-15: 650 mg via ORAL
  Filled 2016-09-15: qty 2

## 2016-09-15 MED ORDER — SODIUM CHLORIDE 0.9 % IV BOLUS (SEPSIS)
1000.0000 mL | Freq: Once | INTRAVENOUS | Status: AC
  Administered 2016-09-16: 1000 mL via INTRAVENOUS

## 2016-09-15 MED ORDER — METOCLOPRAMIDE HCL 5 MG/ML IJ SOLN
10.0000 mg | Freq: Once | INTRAMUSCULAR | Status: AC
  Administered 2016-09-15: 10 mg via INTRAVENOUS
  Filled 2016-09-15: qty 2

## 2016-09-15 MED ORDER — DIPHENHYDRAMINE HCL 50 MG/ML IJ SOLN
25.0000 mg | Freq: Once | INTRAMUSCULAR | Status: AC
  Administered 2016-09-15: 25 mg via INTRAVENOUS
  Filled 2016-09-15: qty 1

## 2016-09-15 NOTE — ED Triage Notes (Addendum)
Patient states she was at work and developed a headache with nausea approx 1300 today. Patient denies light or sound sensitivity. Patient states that she took Ibuprofen x 2 with very little relief.

## 2016-09-15 NOTE — ED Provider Notes (Signed)
[facility]-EMERGENCY DEPT Provider Note   CSN: [id number] Arrival date & time: 09/15/16  1641     History   Chief Complaint Chief Complaint  Patient presents with  . Headache  . Nausea    HPI Leslie Wood is a 51 y.o. female.  HPI   51 year old female with history of obesity, diabetes, hypertension, asthma presenting for evaluation of headache. Patient developed gradual onset of frontal headache that started earlier today and has been ongoing for the past 6 hours. She describes headache as a throbbing sensation across her forehead, with feeling nausea, and shaky. She had treated her symptoms to her diabetes. Headache is moderate in severity. She denies any associated fever, chills, diplopia, confusion, URI symptoms, chest pain, shortness of breath, abdominal pain, back pain, dysuria. She does report polyuria and polydipsia. She does have history of diabetes that is not well controlled, ran out of her insulin treatment  Past Medical History:  Diagnosis Date  . Asthma   . Diabetes mellitus without complication (HCC)   . Hypercholesterolemia   . Hypertension   . Obesity     There are no active problems to display for this patient.   Past Surgical History:  Procedure Laterality Date  . CESAREAN SECTION      OB History    No data available       Home Medications    Prior to Admission medications   Medication Sig Start Date End Date Taking? Authorizing Provider  glimepiride (AMARYL) 4 MG tablet Take 4 mg by mouth daily before breakfast.   Yes [provider]  ibuprofen (ADVIL,MOTRIN) 200 MG tablet Take 200 mg by mouth every 6 (six) hours as needed for pain.   Yes [provider]  losartan-hydrochlorothiazide Mauri Reading) 50-12.5 MG tablet  09/04/16  Yes [provider]  pravastatin (PRAVACHOL) 20 MG tablet Take 20 mg by mouth daily.   Yes [provider]  ondansetron (ZOFRAN) 4 MG tablet Take 1 tablet (4 mg total) by mouth every 6 (six) hours as  needed for nausea or vomiting. Patient not taking: Reported on 09/15/2016 07/06/13   Dione Booze, MD    Family History Family History  Problem Relation Age of Onset  . Hypertension Mother   . Diabetes Mother     Social History Social History  Substance Use Topics  . Smoking status: Never Smoker  . Smokeless tobacco: Never Used  . Alcohol use No     Allergies   Patient has no known allergies.   Review of Systems Review of Systems  All other systems reviewed and are negative.    Physical Exam Updated Vital Signs BP 113/84 (BP Location: Left Arm)   Pulse (!) 102   Temp 98.7 F (37.1 C) (Oral)   Resp 20   Wt 82 kg (180 lb 11.2 oz)   LMP 05/22/2012   SpO2 100%   Physical Exam  Constitutional: She is oriented to person, place, and time. She appears well-developed and well-nourished. No distress.  HENT:  Head: Atraumatic.  Mouth/Throat: Oropharynx is clear and moist.  Eyes: Conjunctivae are normal.  Neck: Normal range of motion. Neck supple.  No nuchal rigidity  Cardiovascular: Normal rate and regular rhythm.   Pulmonary/Chest: Effort normal and breath sounds normal.  Abdominal: Soft. Bowel sounds are normal. She exhibits no distension. There is no tenderness.  Neurological: She is alert and oriented to person, place, and time.  Neurologic exam:  Speech clear, pupils equal round reactive to light, extraocular movements intact  Normal peripheral visual fields Cranial nerves III through XII normal including no facial droop Follows commands, moves all extremities x4, normal strength to bilateral upper and lower extremities at all major muscle groups including grip Sensation normal to light touch Coordination intact, no limb ataxia, finger-nose-finger normal Rapid alternating movements normal No pronator drift Gait not tested   Skin: No rash noted.  Psychiatric: She has a normal mood and affect.  Nursing note and vitals reviewed.    ED Treatments / Results    Labs (all labs ordered are listed, but only abnormal results are displayed) Labs Reviewed - No data to display  EKG  EKG Interpretation None       Radiology No results found.  Procedures Procedures (including critical care time)  Medications Ordered in ED Medications - No data to display   Initial Impression / Assessment and Plan / ED Course  I have reviewed the triage vital signs and the nursing notes.  Pertinent labs & imaging results that were available during my care of the patient were reviewed by me and considered in my medical decision making (see chart for details).     BP 100/61   Pulse 99   Temp 98.7 F (37.1 C) (Oral)   Resp 16   Wt 82 kg (180 lb 11.2 oz)   LMP 05/22/2012   SpO2 95%    Final Clinical Impressions(s) / ED Diagnoses   Final diagnoses:  Bad headache    New Prescriptions New Prescriptions   No medications on file   6:56 PM Patient here with gradual onset of frontal headache. Also has history of diabetes that is not well treated. She has no focal neuro deficit on exam concerning for stroke or space occupying lesion. No fever or nuchal rigidity concerning for meningitis, and no acute onset thunderclap headache concerning for subarachnoid hemorrhage. Workup initiated, migraine cocktail given.  11:58 PM Pt report minimal improvement with migraine cocktail (toradol, benadryl, reglan)  A head CT was obtained showing no acute intracranial process.  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  Recommend MRI of the brain with contrast and sagitall T2 sequence.    I discuss this finding with oncall neurologist, Dr. Amada JupiterKirkpatrick who recommend obtaining head CT angiogram with contrast including venous phase to r/o venous sinus thrombosis.  If negative, pt can have brain MRI outpt at a later time.  Since pt does not report worsening headache with positional changes, we will continue treating her  headache with Depakene 1g and Mag Sulfate 2g via IV with IVF.  Care discussed with Dr. Rush Landmarkegeler.   12:23 AM Pt sign out to oncoming provider.  Pt will need result of head CTA.  outpt brain MRI if CT negative. Monitor for resolution of headache with depakene and mag sulfate.  Pt does have a PCP to f/u    Fayrene Helperran, Valon Glasscock, PA-C 09/16/16 0105    Tegeler, Canary Brimhristopher J, MD 09/21/16 1013

## 2016-09-16 ENCOUNTER — Emergency Department (HOSPITAL_COMMUNITY): Payer: Medicaid Other

## 2016-09-16 ENCOUNTER — Encounter (HOSPITAL_COMMUNITY): Payer: Self-pay | Admitting: Radiology

## 2016-09-16 MED ORDER — IOPAMIDOL (ISOVUE-370) INJECTION 76%: 100.0000 mL | Freq: Once | INTRAVENOUS | Status: DC | PRN

## 2016-09-16 MED ORDER — IOPAMIDOL (ISOVUE-370) INJECTION 76%
INTRAVENOUS | Status: AC
  Filled 2016-09-16: qty 100

## 2016-09-16 MED ORDER — IOPAMIDOL (ISOVUE-370) INJECTION 76%
80.0000 mL | Freq: Once | INTRAVENOUS | Status: AC | PRN
  Administered 2016-09-16: 80 mL via INTRAVENOUS

## 2016-09-16 NOTE — ED Notes (Signed)
Patient ambulated independently to restroom.

## 2016-09-16 NOTE — ED Notes (Signed)
Patient verbalized understanding of following up with PCP for MRI and neurologist.

## 2016-09-16 NOTE — Discharge Instructions (Signed)
The CT scans today do not show any evidence of emergent findings.  It is recommended that you follow-up with your doctor for an outpatient MRI.

## 2016-09-16 NOTE — ED Notes (Signed)
Neuro assessment performed with this RN

## 2016-09-16 NOTE — ED Notes (Signed)
Pt transported to CT ?

## 2016-09-16 NOTE — ED Provider Notes (Signed)
Patient pending CTA of head.   DC if normal and PCP, outpatient MRI.  3:58 AM CTA as below. Discussed the findings with Dr. Amada JupiterKirkpatrick, who recommends DC and outpatient follow-up.  Results for orders placed or performed during the hospital encounter of 09/15/16  CBC with Differential  Result Value Ref Range   WBC 8.0 4.0 - 10.5 K/uL   RBC 4.96 3.87 - 5.11 MIL/uL   Hemoglobin 13.4 12.0 - 15.0 g/dL   HCT 16.140.4 09.636.0 - 04.546.0 %   MCV 81.5 78.0 - 100.0 fL   MCH 27.0 26.0 - 34.0 pg   MCHC 33.2 30.0 - 36.0 g/dL   RDW 40.913.3 81.111.5 - 91.415.5 %   Platelets 288 150 - 400 K/uL   Neutrophils Relative % 72 %   Neutro Abs 5.8 1.7 - 7.7 K/uL   Lymphocytes Relative 10 %   Lymphs Abs 0.8 0.7 - 4.0 K/uL   Monocytes Relative 16 %   Monocytes Absolute 1.3 (H) 0.1 - 1.0 K/uL   Eosinophils Relative 2 %   Eosinophils Absolute 0.2 0.0 - 0.7 K/uL   Basophils Relative 0 %   Basophils Absolute 0.0 0.0 - 0.1 K/uL  Comprehensive metabolic panel  Result Value Ref Range   Sodium 137 135 - 145 mmol/L   Potassium 4.0 3.5 - 5.1 mmol/L   Chloride 101 101 - 111 mmol/L   CO2 28 22 - 32 mmol/L   Glucose, Bld 77 65 - 99 mg/dL   BUN 24 (H) 6 - 20 mg/dL   Creatinine, Ser 7.820.99 0.44 - 1.00 mg/dL   Calcium 9.6 8.9 - 95.610.3 mg/dL   Total Protein 8.4 (H) 6.5 - 8.1 g/dL   Albumin 3.8 3.5 - 5.0 g/dL   AST 68 (H) 15 - 41 U/L   ALT 73 (H) 14 - 54 U/L   Alkaline Phosphatase 78 38 - 126 U/L   Total Bilirubin 0.5 0.3 - 1.2 mg/dL   GFR calc non Af Amer >60 >60 mL/min   GFR calc Af Amer >60 >60 mL/min   Anion gap 8 5 - 15  Urinalysis, Routine w reflex microscopic  Result Value Ref Range   Color, Urine YELLOW YELLOW   APPearance CLEAR CLEAR   Specific Gravity, Urine 1.017 1.005 - 1.030   pH 5.0 5.0 - 8.0   Glucose, UA NEGATIVE NEGATIVE mg/dL   Hgb urine dipstick NEGATIVE NEGATIVE   Bilirubin Urine NEGATIVE NEGATIVE   Ketones, ur NEGATIVE NEGATIVE mg/dL   Protein, ur NEGATIVE NEGATIVE mg/dL   Nitrite NEGATIVE NEGATIVE    Leukocytes, UA NEGATIVE NEGATIVE  Troponin I  Result Value Ref Range   Troponin I <0.03 <0.03 ng/mL  [facility] preg, [facility]  Result Value Ref Range   Preg Test, Ur NEGATIVE NEGATIVE   Ct Angio Head W Or Wo Contrast  Result Date: 09/16/2016 CLINICAL DATA:  Acute onset moderate headache.  Nausea. EXAM: CT ANGIOGRAPHY HEAD AND NECK TECHNIQUE: Multidetector CT imaging of the head and neck was performed using the standard protocol during bolus administration of intravenous contrast. Multiplanar CT image reconstructions and MIPs were obtained to evaluate the vascular anatomy. Carotid stenosis measurements (when applicable) are obtained utilizing NASCET criteria, using the distal internal carotid diameter as the denominator. CONTRAST:  80 cc Isovue 370 COMPARISON:  CT HEAD September 15, 2016 FINDINGS: CTA NECK AORTIC ARCH: Normal appearance of the thoracic arch, 2 vessel arch is a normal variant. Mild calcific atherosclerosis. The origins of the innominate, left  Common carotid artery and subclavian artery are widely patent. RIGHT CAROTID SYSTEM: Common carotid artery is widely patent, coursing in a straight line fashion. Normal appearance of the carotid bifurcation without hemodynamically significant stenosis by NASCET criteria. Normal appearance of the included internal carotid artery. LEFT CAROTID SYSTEM: Common carotid artery is widely patent, coursing in a straight line fashion. Normal appearance of the carotid bifurcation without hemodynamically significant stenosis by NASCET criteria. Normal appearance of the included internal carotid artery. VERTEBRAL ARTERIES:Left vertebral artery is dominant. Normal appearance of the vertebral arteries, which appear widely patent. SKELETON: No acute osseous process though bone windows have not been submitted. OTHER NECK: Soft tissues of the neck are nonacute though, not tailored for evaluation. UPPER CHEST: Included lung apices are clear. Main pulmonary artery is  mildly enlarged at 3.1 cm seen with chronic pulmonary arterial hypertension. No superior mediastinal lymphadenopathy. CTA HEAD- delayed phase. ANTERIOR CIRCULATION: Patent cervical internal carotid arteries, petrous, cavernous and supra clinoid internal carotid arteries. 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 Widely patent anterior communicating artery. Patent anterior and middle cerebral arteries. No large vessel occlusion, significant stenosis, contrast extravasation. POSTERIOR CIRCULATION: Patent vertebral arteries, vertebrobasilar junction and basilar artery, as well as main branch vessels. Patent posterior cerebral arteries. Tiny bilateral posterior communicating artery is present. No large vessel occlusion, significant stenosis, contrast extravasation or aneurysm. VENOUS SINUSES: Major dural venous sinuses are patent though not tailored for evaluation on this angiographic examination. ANATOMIC VARIANTS: None. DELAYED PHASE: No abnormal intracranial enhancement. 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 MIP images reviewed. IMPRESSION: CTA NECK: 1. No hemodynamically significant stenosis or acute vascular process. CTA HEAD: 1. No emergent large vessel occlusion, stenosis or acute vascular process. 2. 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 Electronically Signed   By: Awilda Metro M.D.   On: 09/16/2016 02:44   Ct Head Wo Contrast  Result Date: 09/15/2016 CLINICAL DATA:  Headache and nausea beginning at work this afternoon. History of hypertension, hypercholesterolemia and diabetes. EXAM: CT HEAD WITHOUT CONTRAST TECHNIQUE: Contiguous axial images were obtained from the base of the skull through the vertex without intravenous contrast. COMPARISON:  None. FINDINGS: BRAIN: No intraparenchymal hemorrhage, mass effect nor midline shift. The ventricles and sulci are normal. No acute large vascular  territory infarcts. No abnormal extra-axial fluid collections. Narrowed prepontine cistern. 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 VASCULAR: Unremarkable. SKULL/SOFT TISSUES: No skull fracture. No significant soft tissue swelling. ORBITS/SINUSES: The included ocular globes and orbital contents are normal.The mastoid aircells and included paranasal sinuses are well-aerated. Under pneumatized mastoid air cells. OTHER: None. IMPRESSION: 1. No acute intracranial process. 2. 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 Recommend MRI of the brain with contrast and sagittal T2 sequence. Electronically Signed   By: Awilda Metro M.D.   On: 09/15/2016 23:11   Ct Angio Neck W Or Wo Contrast  Result Date: 09/16/2016 CLINICAL DATA:  Acute onset moderate headache.  Nausea. EXAM: CT ANGIOGRAPHY HEAD AND NECK TECHNIQUE: Multidetector CT imaging of the head and neck was performed using the standard protocol during bolus administration of intravenous contrast. Multiplanar CT image reconstructions and MIPs were obtained to evaluate the vascular anatomy. Carotid stenosis measurements (when applicable) are obtained utilizing NASCET criteria, using the distal internal carotid diameter as the denominator. CONTRAST:  80 cc Isovue 370 COMPARISON:  CT HEAD September 15, 2016 FINDINGS: CTA NECK AORTIC ARCH:  Normal appearance of the thoracic arch, 2 vessel arch is a normal variant. Mild calcific atherosclerosis. The origins of the innominate, left Common carotid artery and subclavian artery are widely patent. RIGHT CAROTID SYSTEM: Common carotid artery is widely patent, coursing in a straight line fashion. Normal appearance of the carotid bifurcation without hemodynamically significant stenosis by NASCET criteria. Normal appearance of the included internal carotid artery. LEFT CAROTID SYSTEM: Common carotid  artery is widely patent, coursing in a straight line fashion. Normal appearance of the carotid bifurcation without hemodynamically significant stenosis by NASCET criteria. Normal appearance of the included internal carotid artery. VERTEBRAL ARTERIES:Left vertebral artery is dominant. Normal appearance of the vertebral arteries, which appear widely patent. SKELETON: No acute osseous process though bone windows have not been submitted. OTHER NECK: Soft tissues of the neck are nonacute though, not tailored for evaluation. UPPER CHEST: Included lung apices are clear. Main pulmonary artery is mildly enlarged at 3.1 cm seen with chronic pulmonary arterial hypertension. No superior mediastinal lymphadenopathy. CTA HEAD- delayed phase. ANTERIOR CIRCULATION: Patent cervical internal carotid arteries, petrous, cavernous and supra clinoid internal carotid arteries. 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 Widely patent anterior communicating artery. Patent anterior and middle cerebral arteries. No large vessel occlusion, significant stenosis, contrast extravasation. POSTERIOR CIRCULATION: Patent vertebral arteries, vertebrobasilar junction and basilar artery, as well as main branch vessels. Patent posterior cerebral arteries. Tiny bilateral posterior communicating artery is present. No large vessel occlusion, significant stenosis, contrast extravasation or aneurysm. VENOUS SINUSES: Major dural venous sinuses are patent though not tailored for evaluation on this angiographic examination. ANATOMIC VARIANTS: None. DELAYED PHASE: No abnormal intracranial enhancement. 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 MIP images reviewed. IMPRESSION: CTA NECK: 1. No hemodynamically significant stenosis or acute vascular process. CTA HEAD: 1. No emergent large vessel occlusion, stenosis or acute vascular process. 2. 2 mm LEFT PCOM origin aneurysm, less  likely infundibulum. Electronically Signed   By: Awilda Metro M.D.   On: 09/16/2016 02:44      Roxy Horseman, PA-C 09/16/16 [id number]    Tegeler, Canary Brim, MD 09/21/16 1013

## 2016-09-17 ENCOUNTER — Encounter (HOSPITAL_COMMUNITY): Payer: Self-pay

## 2016-09-17 ENCOUNTER — Observation Stay (HOSPITAL_COMMUNITY)
Admission: EM | Admit: 2016-09-17 | Discharge: 2016-09-18 | Disposition: A | Discharge status: Home / Self Care | Payer: Medicaid Other | Attending: Internal Medicine | Admitting: Internal Medicine

## 2016-09-17 ENCOUNTER — Emergency Department (HOSPITAL_COMMUNITY): Payer: Medicaid Other

## 2016-09-17 DIAGNOSIS — R072 Precordial pain: Secondary | ICD-10-CM | POA: Diagnosis not present

## 2016-09-17 DIAGNOSIS — E7800 Pure hypercholesterolemia, unspecified: Secondary | ICD-10-CM | POA: Diagnosis not present

## 2016-09-17 DIAGNOSIS — I10 Essential (primary) hypertension: Secondary | ICD-10-CM | POA: Diagnosis present

## 2016-09-17 DIAGNOSIS — R079 Chest pain, unspecified: Secondary | ICD-10-CM

## 2016-09-17 DIAGNOSIS — E119 Type 2 diabetes mellitus without complications: Secondary | ICD-10-CM

## 2016-09-17 DIAGNOSIS — R0789 Other chest pain: Principal | ICD-10-CM | POA: Insufficient documentation

## 2016-09-17 DIAGNOSIS — J4520 Mild intermittent asthma, uncomplicated: Secondary | ICD-10-CM

## 2016-09-17 DIAGNOSIS — J45909 Unspecified asthma, uncomplicated: Secondary | ICD-10-CM | POA: Insufficient documentation

## 2016-09-17 DIAGNOSIS — Z794 Long term (current) use of insulin: Secondary | ICD-10-CM | POA: Diagnosis not present

## 2016-09-17 LAB — CBC
HCT: 39 % (ref 36.0–46.0)
HEMOGLOBIN: 12.4 g/dL (ref 12.0–15.0)
MCH: 26.5 pg (ref 26.0–34.0)
MCHC: 31.8 g/dL (ref 30.0–36.0)
MCV: 83.3 fL (ref 78.0–100.0)
Platelets: 271 10*3/uL (ref 150–400)
RBC: 4.68 MIL/uL (ref 3.87–5.11)
RDW: 13.4 % (ref 11.5–15.5)
WBC: 5.7 10*3/uL (ref 4.0–10.5)

## 2016-09-17 LAB — BASIC METABOLIC PANEL
Anion gap: 7 (ref 5–15)
BUN: 14 mg/dL (ref 6–20)
CALCIUM: 9 mg/dL (ref 8.9–10.3)
CHLORIDE: 104 mmol/L (ref 101–111)
CO2: 26 mmol/L (ref 22–32)
Creatinine, Ser: 0.84 mg/dL (ref 0.44–1.00)
Glucose, Bld: 88 mg/dL (ref 65–99)
Potassium: 3.5 mmol/L (ref 3.5–5.1)
SODIUM: 137 mmol/L (ref 135–145)

## 2016-09-17 LAB — GLUCOSE, CAPILLARY: GLUCOSE-CAPILLARY: 236 mg/dL — AB (ref 65–99)

## 2016-09-17 LAB — HEMOGLOBIN A1C
HEMOGLOBIN A1C: 11.1 % — AB (ref 4.8–5.6)
MEAN PLASMA GLUCOSE: 271.87 mg/dL

## 2016-09-17 LAB — I-STAT TROPONIN, ED: TROPONIN I, POC: 0 ng/mL (ref 0.00–0.08)

## 2016-09-17 LAB — TROPONIN I: Troponin I: <0.03 ng/mL (ref <0.03)

## 2016-09-17 MED ORDER — MORPHINE SULFATE (PF) 2 MG/ML IV SOLN: 2.0000 mg | INTRAVENOUS | Status: DC | PRN

## 2016-09-17 MED ORDER — ASPIRIN EC 325 MG PO TBEC
325.0000 mg | DELAYED_RELEASE_TABLET | Freq: Every day | ORAL | Status: DC
  Administered 2016-09-18: 325 mg via ORAL
  Filled 2016-09-17 (×2): qty 1

## 2016-09-17 MED ORDER — INSULIN GLARGINE 100 UNIT/ML ~~LOC~~ SOLN
15.0000 [IU] | Freq: Every day | SUBCUTANEOUS | Status: DC
  Filled 2016-09-17: qty 0.15

## 2016-09-17 MED ORDER — ENOXAPARIN SODIUM 40 MG/0.4ML ~~LOC~~ SOLN
40.0000 mg | SUBCUTANEOUS | Status: DC
  Administered 2016-09-17: 40 mg via SUBCUTANEOUS
  Filled 2016-09-17: qty 0.4

## 2016-09-17 MED ORDER — IBUPROFEN 200 MG PO TABS: 400.0000 mg | ORAL_TABLET | Freq: Four times a day (QID) | ORAL | Status: DC | PRN

## 2016-09-17 MED ORDER — GI COCKTAIL ~~LOC~~: 30.0000 mL | Freq: Four times a day (QID) | ORAL | Status: DC | PRN

## 2016-09-17 MED ORDER — INSULIN ASPART 100 UNIT/ML ~~LOC~~ SOLN: 0.0000 [IU] | Freq: Three times a day (TID) | SUBCUTANEOUS | Status: DC

## 2016-09-17 MED ORDER — ALBUTEROL SULFATE (2.5 MG/3ML) 0.083% IN NEBU: 2.5000 mg | INHALATION_SOLUTION | Freq: Four times a day (QID) | RESPIRATORY_TRACT | Status: DC | PRN

## 2016-09-17 MED ORDER — LOSARTAN POTASSIUM-HCTZ 50-12.5 MG PO TABS: 1.0000 | ORAL_TABLET | Freq: Every day | ORAL | Status: DC

## 2016-09-17 MED ORDER — ACETAMINOPHEN 500 MG PO TABS: 1000.0000 mg | ORAL_TABLET | Freq: Four times a day (QID) | ORAL | Status: DC | PRN

## 2016-09-17 MED ORDER — NITROGLYCERIN 0.4 MG SL SUBL: 0.4000 mg | SUBLINGUAL_TABLET | SUBLINGUAL | Status: DC | PRN

## 2016-09-17 MED ORDER — INSULIN ASPART 100 UNIT/ML ~~LOC~~ SOLN
0.0000 [IU] | Freq: Every day | SUBCUTANEOUS | Status: DC
  Administered 2016-09-17: 2 [IU] via SUBCUTANEOUS

## 2016-09-17 MED ORDER — PRAVASTATIN SODIUM 20 MG PO TABS
20.0000 mg | ORAL_TABLET | Freq: Every day | ORAL | Status: DC
  Administered 2016-09-17 – 2016-09-18 (×2): 20 mg via ORAL
  Filled 2016-09-17 (×2): qty 1

## 2016-09-17 MED ORDER — ONDANSETRON HCL 4 MG/2ML IJ SOLN
4.0000 mg | Freq: Four times a day (QID) | INTRAMUSCULAR | Status: DC | PRN
  Filled 2016-09-17: qty 2

## 2016-09-17 MED ORDER — HYDROCHLOROTHIAZIDE 12.5 MG PO CAPS
12.5000 mg | ORAL_CAPSULE | Freq: Every day | ORAL | Status: DC
  Filled 2016-09-17: qty 1

## 2016-09-17 MED ORDER — HYDRALAZINE HCL 20 MG/ML IJ SOLN
5.0000 mg | INTRAMUSCULAR | Status: DC | PRN
Start: 2016-09-17 — End: 2016-09-18

## 2016-09-17 MED ORDER — LOSARTAN POTASSIUM 50 MG PO TABS
50.0000 mg | ORAL_TABLET | Freq: Every day | ORAL | Status: DC
  Filled 2016-09-17: qty 1

## 2016-09-17 NOTE — H&P (Signed)
History and Physical   Leslie Wood ZOX:[id number] DOB: Jun 19, 1965 DOA: 09/17/2016  Referring MD/NP/PA: Conni Elliot, PA, EDP PCP: Fleet Contras, MD   Chief Complaint: Chest pain  HPI: Leslie Wood is a 51 y.o. female with a history of HTN, HLD, IDDM who presented to the ED 9/9 with sudden onset chest pain. She describes sitting at home when she noted sudden onset, severe chest "aching" in the middle of the chest radiating to the back which worsened when she laid supine, but not necessarily better when leaning forward. It remained constant, associated with dyspnea, so she called EMS who provided NTG and ASA en route with improvement from level 10/10 to 4/10. In the ED vital signs have been stable, work up including normal labs, negative CXR, undetectable troponin, and nonischemic ECG. Based on risk factors and presentation, chest pain observation was requested.    Recently seen in ED for severe headache with negative imaging work up which has resolved.   ROS: Denies fever, sick contacts, chills, weight loss, changes in vision or hearing, cough, sore throat, palpitations, abdominal pain, nausea, vomiting, changes in bowel habits, blood in stool, change in bladder habits, myalgias, arthralgias, and rash. All others reviewed and are negative.  PMH: IDDM, HLD, HTN, asthma Surgical Hx: C section SH: Never smoker, no EtOH. Lives with Son and Leslie Wood in [place]. No illicit drugs now or ever.  Allergies: NKA, but did not tolerate metformin due to GI issues. FH: HTN and DM in mom. No premature CAD.  Medication Sig  acetaminophen (TYLENOL) 500 MG tablet Take 1,000 mg by mouth every 6 (six) hours as needed for headache (pain).  albuterol (PROAIR HFA) 108 (90 Base) MCG/ACT inhaler Inhale 2 puffs into the lungs every 6 (six) hours as needed for wheezing or shortness of breath.  glimepiride (AMARYL) 4 MG tablet Take 4 mg by mouth daily before breakfast.  ibuprofen (ADVIL,MOTRIN) 200 MG tablet Take 400 mg by mouth every 6  (six) hours as needed for headache (pain).   Insulin Glargine (TOUJEO SOLOSTAR) 300 UNIT/ML SOPN Inject 30 Units into the skin daily.  losartan-hydrochlorothiazide (HYZAAR) 50-12.5 MG tablet Take 1 tablet by mouth daily.   pravastatin (PRAVACHOL) 20 MG tablet Take 20 mg by mouth daily.  ondansetron (ZOFRAN) 4 MG tablet Take 1 tablet (4 mg total) by mouth every 6 (six) hours as needed for nausea or vomiting. Patient not taking: Reported on 09/15/2016   Physical Exam: BP 120/62   Pulse 76   Temp 98 F (36.7 C) (Oral)   Resp 14   Ht  (1.651 m)   Wt 81.6 kg (180 lb)   LMP 05/22/2012 Comment: negative pregnancy test result 09-15-16  SpO2 97%   BMI 29.95 kg/m   Constitutional: 51 y.o. female in no distress, calm demeanor Eyes: Lids and conjunctivae normal, PERRL ENMT: Mucous membranes are moist. Posterior pharynx clear of any exudate or lesions. Fair dentition.  Neck: normal, supple, no masses, no thyromegaly Respiratory: Non-labored breathing room air without accessory muscle use. Clear breath sounds to auscultation bilaterally Cardiovascular: Regular rate and rhythm, narrow complexes on monitor, there are no murmurs, rubs, or gallops. No carotid bruits. No JVD. No LE edema. Radial, carotid and DP pulses are 2+ and symmetric. Abdomen: Normoactive bowel sounds. No tenderness, non-distended, and no masses palpated. No hepatosplenomegaly. GU: No indwelling catheter Musculoskeletal: No clubbing / cyanosis. No joint deformity upper and lower extremities. Good ROM, no contractures. Normal muscle tone.  Skin: Warm, dry. No rashes, wounds, no ulcers.  No significant lesions noted.  Neurologic: CN II-XII grossly intact. Gait not assessed. Speech normal. No focal deficits in motor strength or sensation in all extremities.  Psychiatric: Alert and oriented x3. Normal judgment and insight. Mood euthymic with congruent affect. Does not appears anxious.  CBC: wnl BMP: wnl Troponin: Neg CXR 2v: On my  interpretation, right hemidiaphragm elevation without infiltrate, effusions, PTX, or vascular congestion. Cardiac silhouette is not enlarged, mediastinum is not widened. Visualized skeleton wnl. EKG: Independently reviewed. NSR, normal axis, no ST segment deviations or T wave inversions, early RWP.   Assessment/Plan Atypical chest pain: Substernal and relieved by NTG, not necessarily exertional. HEART score 4, with many RF's and no prior work up. DDx includes aortic dissection with sudden chest pain radiating to the back, though pulses equal bilaterally, no hypotension, normal cardiac exam, CXR without widened mediastinum. PE considered unlikely without tachycardia or hypoxia or RF's. MSK pain is possible with worsening with movements.  - Chest pain observation: Cycle enzymes, repeat ECG in AM, NPO p MN pending cardiology evaluation.  - Morphine, NTG, O2 prn. ASA given.  - No stigmata of heart failure, so will defer echo  Essential HTN:  - Continue home antihypertensives ARB/HCTZ  IDT2DM:  - Glucose wnl on metabolic panel.  - Decrease basal insulin in AM to 20u + sensitive SSI, hold OSU - Check HbA1c  Hyperlipidemia: Chronic.  - Continue medium intensity statin pending repeat lipid panel.   Headache: Recently negative work up.  - Tylenol, ibuprofen prn.   Intermittent asthma, mild: Last MDI use was 2 months ago - Albuterol MDI prn  DVT prophylaxis: Lovenox  Code Status: Full confirmed at admission  Family Communication: Daughter at bedside Disposition Plan: Anticipate DC home pending cardiac work up CiscoConsults called: Cardiology moonlighter called by EDP. Stated NPO after MN pending formal consult in AM.   Admission status: Observation    Hazeline Junkeryan Czarina Gingras, MD [facility] Pager [phone]  If 7PM-7AM, please contact night-coverage [email or web address] Password [email or web address] 09/17/2016, 5:53 PM

## 2016-09-17 NOTE — ED Notes (Signed)
Dinner tray ordered; regular diet 

## 2016-09-17 NOTE — ED Triage Notes (Signed)
Pt BIB [facility] from home where pt reports sudden onset of central CP with radiation to back and left arm. 2 Nitro and 325 ASA given PTA. Pt initially rated pain 8/10 which decreased to 4/10 after EMS gave medication.

## 2016-09-17 NOTE — ED Provider Notes (Signed)
[facility]-EMERGENCY DEPT Provider Note   CSN: [id number] Arrival date & time: 09/17/16  1404     History   Chief Complaint Chief Complaint  Patient presents with  . Chest Pain    HPI Leslie Wood is a 51 y.o. female with history of hypertension, hyperlipidemia, diabetes, obesity who presents with a three-hour history of central chest pain. She is unable to qualify her pain. Patient reports she was sitting down and laying down when her chest pain started. Sitting up improved her pain. Patient had some radiation of pain to her back and left arm. She was given 2 nitroglycerin and 325 aspirin by EMS with pain reduction from 8/10 to 4/10. Patient reports having similar pain in the past, however was never evaluated for it. Patient has no known cardiac history. Patient denies any recent long trips in the car, plane, recent surgeries, cancer, exogenous estrogen use, new leg pain or swelling. Her pain is not pleuritic.  HPI  Past Medical History:  Diagnosis Date  . Asthma   . Diabetes mellitus without complication (HCC)   . Hypercholesterolemia   . Hypertension   . Obesity     Patient Active Problem List   Diagnosis Date Noted  . Substernal chest pain 09/17/2016  . Diabetes mellitus without complication (HCC) 09/17/2016  . Asthma 09/17/2016  . Hypercholesterolemia 09/17/2016  . Benign essential HTN 09/17/2016    Past Surgical History:  Procedure Laterality Date  . CESAREAN SECTION      OB History    No data available       Home Medications    Prior to Admission medications   Medication Sig Start Date End Date Taking? Authorizing Provider  acetaminophen (TYLENOL) 500 MG tablet Take 1,000 mg by mouth every 6 (six) hours as needed for headache (pain).   Yes [provider]  albuterol (PROAIR HFA) 108 (90 Base) MCG/ACT inhaler Inhale 2 puffs into the lungs every 6 (six) hours as needed for wheezing or shortness of breath.   Yes [provider]  glimepiride (AMARYL)  4 MG tablet Take 4 mg by mouth daily before breakfast.   Yes [provider]  ibuprofen (ADVIL,MOTRIN) 200 MG tablet Take 400 mg by mouth every 6 (six) hours as needed for headache (pain).    Yes [provider]  Insulin Glargine (TOUJEO SOLOSTAR) 300 UNIT/ML SOPN Inject 30 Units into the skin daily.   Yes [provider]  losartan-hydrochlorothiazide (HYZAAR) 50-12.5 MG tablet Take 1 tablet by mouth daily.  09/04/16  Yes [provider]  pravastatin (PRAVACHOL) 20 MG tablet Take 20 mg by mouth daily.   Yes [provider]  ondansetron (ZOFRAN) 4 MG tablet Take 1 tablet (4 mg total) by mouth every 6 (six) hours as needed for nausea or vomiting. Patient not taking: Reported on 09/15/2016 07/06/13   Dione Booze, MD    Family History Family History  Problem Relation Age of Onset  . Hypertension Mother   . Diabetes Mother     Social History Social History  Substance Use Topics  . Smoking status: Never Smoker  . Smokeless tobacco: Never Used  . Alcohol use No     Allergies   Patient has no known allergies.   Review of Systems Review of Systems  Constitutional: Negative for chills and fever.  HENT: Negative for facial swelling and sore throat.   Respiratory: Negative for shortness of breath.   Cardiovascular: Positive for chest pain.  Gastrointestinal: Negative for abdominal pain, nausea and  vomiting.  Genitourinary: Negative for dysuria.  Musculoskeletal: Negative for back pain.  Skin: Negative for rash and wound.  Neurological: Negative for headaches.  Psychiatric/Behavioral: The patient is not nervous/anxious.      Physical Exam Updated Vital Signs BP 120/62   Pulse 76   Temp 98 F (36.7 C) (Oral)   Resp 14   Ht  (1.651 m)   Wt 81.6 kg (180 lb)   LMP 05/22/2012 Comment: negative pregnancy test result 09-15-16  SpO2 97%   BMI 29.95 kg/m   Physical Exam  Constitutional: She appears well-developed and well-nourished. No  distress.  HENT:  Head: Normocephalic and atraumatic.  Mouth/Throat: Oropharynx is clear and moist. No oropharyngeal exudate.  Eyes: Pupils are equal, round, and reactive to light. Conjunctivae are normal. Right eye exhibits no discharge. Left eye exhibits no discharge. No scleral icterus.  Neck: Normal range of motion. Neck supple. No thyromegaly present.  Cardiovascular: Normal rate, regular rhythm, normal heart sounds and intact distal pulses.  Exam reveals no gallop and no friction rub.   No murmur heard. Pulmonary/Chest: Effort normal and breath sounds normal. No stridor. No respiratory distress. She has no wheezes. She has no rales. She exhibits no tenderness.  Abdominal: Soft. Bowel sounds are normal. She exhibits no distension. There is no tenderness. There is no rebound and no guarding.  Musculoskeletal: She exhibits no edema.  Lymphadenopathy:    She has no cervical adenopathy.  Neurological: She is alert. Coordination normal.  Skin: Skin is warm and dry. No rash noted. She is not diaphoretic. No pallor.  Psychiatric: She has a normal mood and affect.  Nursing note and vitals reviewed.    ED Treatments / Results  Labs (all labs ordered are listed, but only abnormal results are displayed) Labs Reviewed  BASIC METABOLIC PANEL  CBC  HEMOGLOBIN A1C  LIPID PANEL  I-STAT TROPONIN, ED    EKG  EKG Interpretation None       Radiology Ct Angio Head W Or Wo Contrast  Result Date: 09/16/2016 CLINICAL DATA:  Acute onset moderate headache.  Nausea. EXAM: CT ANGIOGRAPHY HEAD AND NECK TECHNIQUE: Multidetector CT imaging of the head and neck was performed using the standard protocol during bolus administration of intravenous contrast. Multiplanar CT image reconstructions and MIPs were obtained to evaluate the vascular anatomy. Carotid stenosis measurements (when applicable) are obtained utilizing NASCET criteria, using the distal internal carotid diameter as the denominator.  CONTRAST:  80 cc Isovue 370 COMPARISON:  CT HEAD September 15, 2016 FINDINGS: CTA NECK AORTIC ARCH: Normal appearance of the thoracic arch, 2 vessel arch is a normal variant. Mild calcific atherosclerosis. The origins of the innominate, left Common carotid artery and subclavian artery are widely patent. RIGHT CAROTID SYSTEM: Common carotid artery is widely patent, coursing in a straight line fashion. Normal appearance of the carotid bifurcation without hemodynamically significant stenosis by NASCET criteria. Normal appearance of the included internal carotid artery. LEFT CAROTID SYSTEM: Common carotid artery is widely patent, coursing in a straight line fashion. Normal appearance of the carotid bifurcation without hemodynamically significant stenosis by NASCET criteria. Normal appearance of the included internal carotid artery. VERTEBRAL ARTERIES:Left vertebral artery is dominant. Normal appearance of the vertebral arteries, which appear widely patent. SKELETON: No acute osseous process though bone windows have not been submitted. OTHER NECK: Soft tissues of the neck are nonacute though, not tailored for evaluation. UPPER CHEST: Included lung apices are clear. Main pulmonary artery is mildly enlarged at 3.1 cm seen with  chronic pulmonary arterial hypertension. No superior mediastinal lymphadenopathy. CTA HEAD- delayed phase. ANTERIOR CIRCULATION: Patent cervical internal carotid arteries, petrous, cavernous and supra clinoid internal carotid arteries. 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 Widely patent anterior communicating artery. Patent anterior and middle cerebral arteries. No large vessel occlusion, significant stenosis, contrast extravasation. POSTERIOR CIRCULATION: Patent vertebral arteries, vertebrobasilar junction and basilar artery, as well as main branch vessels. Patent posterior cerebral arteries. Tiny bilateral posterior communicating  artery is present. No large vessel occlusion, significant stenosis, contrast extravasation or aneurysm. VENOUS SINUSES: Major dural venous sinuses are patent though not tailored for evaluation on this angiographic examination. ANATOMIC VARIANTS: None. DELAYED PHASE: No abnormal intracranial enhancement. 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 MIP images reviewed. IMPRESSION: CTA NECK: 1. No hemodynamically significant stenosis or acute vascular process. CTA HEAD: 1. No emergent large vessel occlusion, stenosis or acute vascular process. 2. 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 Electronically Signed   By: Awilda Metroourtnay  Bloomer M.D.   On: 09/16/2016 02:44   Dg Chest 2 View  Result Date: 09/17/2016 CLINICAL DATA:  Left-sided chest pain. EXAM: CHEST  2 VIEW COMPARISON:  06/25/2012 FINDINGS: There is elevation of the right diaphragm. There is no focal parenchymal opacity. There is no pleural effusion or pneumothorax. The heart and mediastinal contours are unremarkable. The osseous structures are unremarkable. IMPRESSION: No active cardiopulmonary disease. Electronically Signed   By: Elige KoHetal  Patel   On: 09/17/2016 15:27   Ct Head Wo Contrast  Result Date: 09/15/2016 CLINICAL DATA:  Headache and nausea beginning at work this afternoon. History of hypertension, hypercholesterolemia and diabetes. EXAM: CT HEAD WITHOUT CONTRAST TECHNIQUE: Contiguous axial images were obtained from the base of the skull through the vertex without intravenous contrast. COMPARISON:  None. FINDINGS: BRAIN: No intraparenchymal hemorrhage, mass effect nor midline shift. The ventricles and sulci are normal. No acute large vascular territory infarcts. No abnormal extra-axial fluid collections. Narrowed prepontine cistern. 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 VASCULAR: Unremarkable. SKULL/SOFT TISSUES: No skull fracture. No significant soft tissue  swelling. ORBITS/SINUSES: The included ocular globes and orbital contents are normal.The mastoid aircells and included paranasal sinuses are well-aerated. Under pneumatized mastoid air cells. OTHER: None. IMPRESSION: 1. No acute intracranial process. 2. 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 Recommend MRI of the brain with contrast and sagittal T2 sequence. Electronically Signed   By: Awilda Metroourtnay  Bloomer M.D.   On: 09/15/2016 23:11   Ct Angio Neck W Or Wo Contrast  Result Date: 09/16/2016 CLINICAL DATA:  Acute onset moderate headache.  Nausea. EXAM: CT ANGIOGRAPHY HEAD AND NECK TECHNIQUE: Multidetector CT imaging of the head and neck was performed using the standard protocol during bolus administration of intravenous contrast. Multiplanar CT image reconstructions and MIPs were obtained to evaluate the vascular anatomy. Carotid stenosis measurements (when applicable) are obtained utilizing NASCET criteria, using the distal internal carotid diameter as the denominator. CONTRAST:  80 cc Isovue 370 COMPARISON:  CT HEAD September 15, 2016 FINDINGS: CTA NECK AORTIC ARCH: Normal appearance of the thoracic arch, 2 vessel arch is a normal variant. Mild calcific atherosclerosis. The origins of the innominate, left Common carotid artery and subclavian artery are widely patent. RIGHT CAROTID SYSTEM: Common carotid artery is widely patent, coursing in a straight line fashion. Normal appearance of the carotid bifurcation without hemodynamically significant stenosis by NASCET criteria. Normal appearance of the included internal carotid  artery. LEFT CAROTID SYSTEM: Common carotid artery is widely patent, coursing in a straight line fashion. Normal appearance of the carotid bifurcation without hemodynamically significant stenosis by NASCET criteria. Normal appearance of the included internal carotid artery. VERTEBRAL ARTERIES:Left vertebral artery is  dominant. Normal appearance of the vertebral arteries, which appear widely patent. SKELETON: No acute osseous process though bone windows have not been submitted. OTHER NECK: Soft tissues of the neck are nonacute though, not tailored for evaluation. UPPER CHEST: Included lung apices are clear. Main pulmonary artery is mildly enlarged at 3.1 cm seen with chronic pulmonary arterial hypertension. No superior mediastinal lymphadenopathy. CTA HEAD- delayed phase. ANTERIOR CIRCULATION: Patent cervical internal carotid arteries, petrous, cavernous and supra clinoid internal carotid arteries. 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 Widely patent anterior communicating artery. Patent anterior and middle cerebral arteries. No large vessel occlusion, significant stenosis, contrast extravasation. POSTERIOR CIRCULATION: Patent vertebral arteries, vertebrobasilar junction and basilar artery, as well as main branch vessels. Patent posterior cerebral arteries. Tiny bilateral posterior communicating artery is present. No large vessel occlusion, significant stenosis, contrast extravasation or aneurysm. VENOUS SINUSES: Major dural venous sinuses are patent though not tailored for evaluation on this angiographic examination. ANATOMIC VARIANTS: None. DELAYED PHASE: No abnormal intracranial enhancement. 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 MIP images reviewed. IMPRESSION: CTA NECK: 1. No hemodynamically significant stenosis or acute vascular process. CTA HEAD: 1. No emergent large vessel occlusion, stenosis or acute vascular process. 2. 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 Electronically Signed   By: Awilda Metro M.D.   On: 09/16/2016 02:44    Procedures Procedures (including critical care time)  Medications Ordered in ED Medications  hydrALAZINE (APRESOLINE) injection 5 mg (not administered)     Initial  Impression / Assessment and Plan / ED Course  I have reviewed the triage vital signs and the nursing notes.  Pertinent labs & imaging results that were available during my care of the patient were reviewed by me and considered in my medical decision making (see chart for details).     Heart score 4. Considering heart score and patient's risk factors, patient to be negative for chest pain rule out. CBC, BMP, initial troponin negative. CXR shows no active cardiopulmonary disease. EKG shows sinus tachycardia, rate 103. I spoke with Dr. Jarvis Newcomer with [facility] who will admit the patient for observation. I also spoke with cardiologist per request of Dr. Jarvis Newcomer, who recommends NPO status for determination of procedure once cardiology evaluates patient in the morning. I discussed patient case with Dr. Clayborne Dana who guided the patient's management and agrees with plan.   Final Clinical Impressions(s) / ED Diagnoses   Final diagnoses:  Nonspecific chest pain    New Prescriptions New Prescriptions   No medications on file     Verdis Prime 09/17/16 1819    Mesner, Barbara Cower, MD 09/17/16 2351

## 2016-09-17 NOTE — ED Notes (Signed)
Pt in XR. 

## 2016-09-18 ENCOUNTER — Encounter (HOSPITAL_COMMUNITY): Payer: Self-pay | Admitting: Radiology

## 2016-09-18 ENCOUNTER — Observation Stay (HOSPITAL_COMMUNITY): Payer: Medicaid Other

## 2016-09-18 DIAGNOSIS — E1165 Type 2 diabetes mellitus with hyperglycemia: Secondary | ICD-10-CM

## 2016-09-18 DIAGNOSIS — R079 Chest pain, unspecified: Secondary | ICD-10-CM | POA: Diagnosis not present

## 2016-09-18 DIAGNOSIS — E119 Type 2 diabetes mellitus without complications: Secondary | ICD-10-CM | POA: Diagnosis not present

## 2016-09-18 DIAGNOSIS — Z794 Long term (current) use of insulin: Secondary | ICD-10-CM

## 2016-09-18 DIAGNOSIS — I10 Essential (primary) hypertension: Secondary | ICD-10-CM | POA: Diagnosis not present

## 2016-09-18 DIAGNOSIS — R072 Precordial pain: Secondary | ICD-10-CM | POA: Diagnosis not present

## 2016-09-18 DIAGNOSIS — E7800 Pure hypercholesterolemia, unspecified: Secondary | ICD-10-CM | POA: Diagnosis not present

## 2016-09-18 LAB — HIV ANTIBODY (ROUTINE TESTING W REFLEX): HIV Screen 4th Generation wRfx: NONREACTIVE

## 2016-09-18 LAB — TROPONIN I
Troponin I: <0.03 ng/mL (ref <0.03)
Troponin I: <0.03 ng/mL (ref <0.03)

## 2016-09-18 LAB — LIPID PANEL
CHOL/HDL RATIO: 3.5 ratio
CHOLESTEROL: 152 mg/dL (ref 0–200)
HDL: 44 mg/dL (ref >40)
LDL Cholesterol: 93 mg/dL (ref 0–99)
TRIGLYCERIDES: 77 mg/dL (ref <150)
VLDL: 15 mg/dL (ref 0–40)

## 2016-09-18 LAB — GLUCOSE, CAPILLARY
GLUCOSE-CAPILLARY: 102 mg/dL — AB (ref 65–99)
Glucose-Capillary: 58 mg/dL — ABNORMAL LOW (ref 65–99)

## 2016-09-18 LAB — CBG MONITORING, ED: Glucose-Capillary: 76 mg/dL (ref 65–99)

## 2016-09-18 MED ORDER — NITROGLYCERIN 0.4 MG SL SUBL
SUBLINGUAL_TABLET | SUBLINGUAL | Status: AC
  Filled 2016-09-18: qty 2

## 2016-09-18 MED ORDER — NITROGLYCERIN 0.4 MG SL SUBL
0.8000 mg | SUBLINGUAL_TABLET | Freq: Once | SUBLINGUAL | Status: AC
  Administered 2016-09-18: 0.8 mg via SUBLINGUAL

## 2016-09-18 MED ORDER — INSULIN GLARGINE 100 UNIT/ML ~~LOC~~ SOLN: 10.0000 [IU] | Freq: Every day | SUBCUTANEOUS | Status: DC

## 2016-09-18 MED ORDER — IOPAMIDOL (ISOVUE-370) INJECTION 76%
INTRAVENOUS | Status: AC
  Administered 2016-09-18: 80 mL
  Filled 2016-09-18: qty 100

## 2016-09-18 MED ORDER — METOPROLOL TARTRATE 50 MG PO TABS
50.0000 mg | ORAL_TABLET | Freq: Once | ORAL | Status: AC
  Administered 2016-09-18: 50 mg via ORAL
  Filled 2016-09-18: qty 1

## 2016-09-18 NOTE — Progress Notes (Addendum)
PROGRESS NOTE    Leslie Wood  UJW:[id number] DOMaudie FlakesB: 1965/02/25 DOA: 09/17/2016 PCP: [name], [name], MD   Outpatient Specialists:     Brief Narrative:  Leslie FlakesGail Wood is a 51 y.o. female with a history of HTN, HLD, IDDM who presented to the ED 9/9 with sudden onset chest pain. She describes sitting at home when she noted sudden onset, severe chest "aching" in the middle of the chest radiating to the back which worsened when she laid supine, but not necessarily better when leaning forward. It remained constant, associated with dyspnea, so she called EMS who provided NTG and ASA en route with improvement from level 10/10 to 4/10. In the ED vital signs have been stable, work up including normal labs, negative CXR, undetectable troponin, and nonischemic ECG. Based on risk factors and presentation, chest pain observation was requested.     Assessment & Plan:   Active Problems:   Substernal chest pain   Diabetes mellitus without complication (HCC)   Asthma   Hypercholesterolemia   Benign essential HTN   Atypical chest pain: -HEART score 4, -CE negative -cards: coronary CTA ordered   Essential HTN:  - Continue home antihypertensives ARB/HCTZ  IDT2DM:  - Glucose wnl on metabolic panel.  - Decrease basal insulin in AM to 20u + sensitive SSI- while NPO - HbA1c 11.1 -needs close outpatient follow up for better control  Hyperlipidemia: Chronic.  - Continue medium intensity statin pending repeat lipid panel.   Headache: Recently negative work up.  - Tylenol, ibuprofen prn.   Intermittent asthma, mild: Last MDI use was 2 months ago - Albuterol MDI prn  Mild elevation of AST/ALT -outpatient follow up   DVT prophylaxis:  Fully anticoagulated   Code Status: Full Code   Family Communication:   Disposition Plan:  Pending work up   Consultants:   cards  Procedures:       Subjective: Pain with movement in chest  Objective: Vitals:   09/17/16 1800 09/17/16 1855  09/17/16 2001 09/18/16 0448  BP: 109/79 118/78 100/78 (!) 116/94  Pulse: 91 82 84 70  Resp: (!) 21 (!) 25 19   Temp:  98.2 F (36.8 C) 98.3 F (36.8 C) 98.2 F (36.8 C)  TempSrc:  Oral    SpO2: 94% 95% 94% 98%  Weight:  81.6 kg (179 lb 12.8 oz)  81.6 kg (180 lb)  Height:  5\' 5"  (1.651 m)      Intake/Output Summary (Last 24 hours) at 09/18/16 1212 Last data filed at 09/18/16 0500  Gross per 24 hour  Intake                0 ml  Output              600 ml  Net             -600 ml   Filed Weights   09/17/16 1426 09/17/16 1855 09/18/16 0448  Weight: 81.6 kg (180 lb) 81.6 kg (179 lb 12.8 oz) 81.6 kg (180 lb)    Examination:  General exam: Appears calm and comfortable  Respiratory system: Clear to auscultation. Respiratory effort normal. Cardiovascular system: S1 & S2 heard, RRR. No JVD, murmurs, rubs, gallops or clicks. No pedal edema. Gastrointestinal system: Abdomen is nondistended, soft and nontender. No organomegaly or masses felt. Normal bowel sounds heard. Central nervous system: Alert and oriented. No focal neurological deficits. Extremities: Symmetric 5 x 5 power. Skin: No rashes, lesions or ulcers Psychiatry: Judgement and insight appear normal. Mood &  affect appropriate.     Data Reviewed: I have personally reviewed following labs and imaging studies  CBC:  Recent Labs Lab 09/15/16 1848 09/17/16 1542  WBC 8.0 5.7  NEUTROABS 5.8  --   HGB 13.4 12.4  HCT 40.4 39.0  MCV 81.5 83.3  PLT 288 271   Basic Metabolic Panel:  Recent Labs Lab 09/15/16 1848 09/17/16 1542  NA 137 137  K 4.0 3.5  CL 101 104  CO2 28 26  GLUCOSE 77 88  BUN 24* 14  CREATININE 0.99 0.84  CALCIUM 9.6 9.0   GFR: Estimated Creatinine Clearance: 82.6 mL/min (by C-G formula based on SCr of 0.84 mg/dL). Liver Function Tests:  Recent Labs Lab 09/15/16 1848  AST 68*  ALT 73*  ALKPHOS 78  BILITOT 0.5  PROT 8.4*  ALBUMIN 3.8   No results for input(s): LIPASE, AMYLASE in the  last 168 hours. No results for input(s): AMMONIA in the last 168 hours. Coagulation Profile: No results for input(s): INR, PROTIME in the last 168 hours. Cardiac Enzymes:  Recent Labs Lab 09/15/16 1848 09/17/16 1903 09/18/16 0022 09/18/16 0326  TROPONINI <0.03 <0.03 <0.03 <0.03   BNP (last 3 results) No results for input(s): PROBNP in the last 8760 hours. HbA1C:  Recent Labs  09/17/16 1851  HGBA1C 11.1*   CBG:  Recent Labs Lab 09/15/16 1903 09/17/16 2057 09/18/16 0736 09/18/16 0830  GLUCAP 76 236* 58* 102*   Lipid Profile:  Recent Labs  09/18/16 0326  CHOL 152  HDL 44  LDLCALC 93  TRIG 77  CHOLHDL 3.5   Thyroid Function Tests: No results for input(s): TSH, T4TOTAL, FREET4, T3FREE, THYROIDAB in the last 72 hours. Anemia Panel: No results for input(s): VITAMINB12, FOLATE, FERRITIN, TIBC, IRON, RETICCTPCT in the last 72 hours. Urine analysis:    Component Value Date/Time   COLORURINE YELLOW 09/15/2016 1848   APPEARANCEUR CLEAR 09/15/2016 1848   LABSPEC 1.017 09/15/2016 1848   PHURINE 5.0 09/15/2016 1848   GLUCOSEU NEGATIVE 09/15/2016 1848   HGBUR NEGATIVE 09/15/2016 1848   BILIRUBINUR NEGATIVE 09/15/2016 1848   KETONESUR NEGATIVE 09/15/2016 1848   PROTEINUR NEGATIVE 09/15/2016 1848   NITRITE NEGATIVE 09/15/2016 1848   LEUKOCYTESUR NEGATIVE 09/15/2016 1848     )No results found for this or any previous visit (from the past 240 hour(s)).    Anti-infectives    None       Radiology Studies: Dg Chest 2 View  Result Date: 09/17/2016 CLINICAL DATA:  Left-sided chest pain. EXAM: CHEST  2 VIEW COMPARISON:  06/25/2012 FINDINGS: There is elevation of the right diaphragm. There is no focal parenchymal opacity. There is no pleural effusion or pneumothorax. The heart and mediastinal contours are unremarkable. The osseous structures are unremarkable. IMPRESSION: No active cardiopulmonary disease. Electronically Signed   By: Elige Ko   On: 09/17/2016 15:27         Scheduled Meds: . aspirin EC  325 mg Oral Daily  . enoxaparin (LOVENOX) injection  40 mg Subcutaneous Q24H  . losartan  50 mg Oral Daily   And  . hydrochlorothiazide  12.5 mg Oral Daily  . insulin aspart  0-5 Units Subcutaneous QHS  . insulin aspart  0-9 Units Subcutaneous TID WC  . insulin glargine  15 Units Subcutaneous Daily  . nitroGLYCERIN      . pravastatin  20 mg Oral Daily   Continuous Infusions:   LOS: 0 days    Time spent: 35 min    Jarrick Fjeld U Cashius Grandstaff,  DO [facility] Pager [phone]  If 7PM-7AM, please contact night-coverage [email or web address] Password [email or web address] 09/18/2016, 12:12 PM

## 2016-09-18 NOTE — Progress Notes (Signed)
Results for Maudie FlakesDAVIS, Gissell (MRN [id number]) as of 09/18/2016 12:13  Ref. Range 09/15/2016 19:03 09/17/2016 20:57 09/18/2016 07:36 09/18/2016 08:30  Glucose-Capillary Latest Ref Range: 65 - 99 mg/dL 76 409236 (H) 58 (L) 811102 (H)  Noted that blood sugars have been less than 70 mg/dl.  Recommend decreasing Lantus to 10 units daily and continuing Novolog SENSITIVE correction scale TID & HS.  Smith MinceKendra Anishka Bushard RN BSN CDE Diabetes Coordinator Pager: [phone]  8am-5pm

## 2016-09-18 NOTE — Consult Note (Signed)
Cardiology Consultation:   Patient ID: Leslie Wood; [id number]; October 04, 1965   Admit date: 09/17/2016 Date of Consult: 09/18/2016  Primary Care Provider: Fleet Contras, MD Primary Cardiologist: [facility]   Patient Profile:   Leslie Wood is a 51 y.o. female with a hx of Insulin-requiring diabetes mellitus, treated hypercholesterolemia and hypertension who is being seen today for the evaluation of chest pain at the request of Dr. Benjamine Mola.  History of Present Illness:   Leslie Wood who was admitted yesterday with complaints of sudden onset chest discomfort at rest. She describes the discomfort as an ache in the retrosternal area that radiates through to her back. There was maybe worsened by lying down. Reportedly improved with nitroglycerin provided by EMS. Mild ache persists but is much less severe. Not clearly pleuritic, but does get a little worse if she coughs. Since also worsened with motion. No relationship to meals or position at this time. She had dyspnea on the pain was severe, now has a dry, nonproductive cough. Denies orthopnea. Has not had problems with edema, unilateral leg swelling or tenderness, hemoptysis, sick contact, fever or chills. Her blood pressure was not elevated when she presented. She was seen in emergency room with a headache 2 days ago and had a negative workup, other than the presence of cerebellar tonsillar ectopia.  ECG is normal and serial cardiac enzymes are normal as well.  Went through menopause roughly 2 years ago. Has long-standing diabetes that is poorly controlled (hemoglobin A1c 11.1%), treated hypertension and hyperlipidemia on statin. She has never smoked. Her family history significant for hypertension diabetes, but as far she knows not for coronary disease.  Past Medical History:  Diagnosis Date  . Asthma   . Diabetes mellitus without complication (HCC)   . Hypercholesterolemia   . Hypertension   . Obesity     Past Surgical History:    Procedure Laterality Date  . CESAREAN SECTION       Home Medications:  Prior to Admission medications   Medication Sig Start Date End Date Taking? Authorizing Provider  acetaminophen (TYLENOL) 500 MG tablet Take 1,000 mg by mouth every 6 (six) hours as needed for headache (pain).   Yes [provider]  albuterol (PROAIR HFA) 108 (90 Base) MCG/ACT inhaler Inhale 2 puffs into the lungs every 6 (six) hours as needed for wheezing or shortness of breath.   Yes [provider]  glimepiride (AMARYL) 4 MG tablet Take 4 mg by mouth daily before breakfast.   Yes [provider]  ibuprofen (ADVIL,MOTRIN) 200 MG tablet Take 400 mg by mouth every 6 (six) hours as needed for headache (pain).    Yes [provider]  Insulin Glargine (TOUJEO SOLOSTAR) 300 UNIT/ML SOPN Inject 30 Units into the skin daily.   Yes [provider]  losartan-hydrochlorothiazide (HYZAAR) 50-12.5 MG tablet Take 1 tablet by mouth daily.  09/04/16  Yes [provider]  pravastatin (PRAVACHOL) 20 MG tablet Take 20 mg by mouth daily.   Yes [provider]  ondansetron (ZOFRAN) 4 MG tablet Take 1 tablet (4 mg total) by mouth every 6 (six) hours as needed for nausea or vomiting. Patient not taking: Reported on 09/15/2016 07/06/13   Dione Booze, MD    Inpatient Medications: Scheduled Meds: . aspirin EC  325 mg Oral Daily  . enoxaparin (LOVENOX) injection  40 mg Subcutaneous Q24H  . losartan  50 mg Oral Daily   And  . hydrochlorothiazide  12.5 mg Oral Daily  . insulin  aspart  0-5 Units Subcutaneous QHS  . insulin aspart  0-9 Units Subcutaneous TID WC  . insulin glargine  15 Units Subcutaneous Daily  . pravastatin  20 mg Oral Daily   Continuous Infusions:  PRN Meds: acetaminophen, albuterol, gi cocktail, hydrALAZINE, ibuprofen, morphine injection, nitroGLYCERIN, ondansetron (ZOFRAN) IV  Allergies:   No Known Allergies  Social History:   Social History   Social  History  . Marital status: Single    Spouse name: N/A  . Number of children: N/A  . Years of education: N/A   Occupational History  . Not on file.   Social History Main Topics  . Smoking status: Never Smoker  . Smokeless tobacco: Never Used  . Alcohol use No  . Drug use: No  . Sexual activity: Not Currently   Other Topics Concern  . Not on file   Social History Narrative  . No narrative on file    Family History:    Family History  Problem Relation Age of Onset  . Hypertension Mother   . Diabetes Mother      ROS:  Please see the history of present illness.  ROS  All other ROS reviewed and negative.     Physical Exam/Data:   Vitals:   09/17/16 1800 09/17/16 1855 09/17/16 2001 09/18/16 0448  BP: 109/79 118/78 100/78 (!) 116/94  Pulse: 91 82 84 70  Resp: (!) 21 (!) 25 19   Temp:  98.2 F (36.8 C) 98.3 F (36.8 C) 98.2 F (36.8 C)  TempSrc:  Oral    SpO2: 94% 95% 94% 98%  Weight:  179 lb 12.8 oz (81.6 kg)  180 lb (81.6 kg)  Height:   (1.651 m)      Intake/Output Summary (Last 24 hours) at 09/18/16 0843 Last data filed at 09/18/16 0500  Gross per 24 hour  Intake                0 ml  Output              600 ml  Net             -600 ml   Filed Weights   09/17/16 1426 09/17/16 1855 09/18/16 0448  Weight: 180 lb (81.6 kg) 179 lb 12.8 oz (81.6 kg) 180 lb (81.6 kg)   Body mass index is 29.95 kg/m.  General:  Well nourished, well developed, in no acute distress HEENT: normal Lymph: no adenopathy Neck: no JVD Endocrine:  No thryomegaly Vascular: No carotid bruits; FA pulses 2+ bilaterally without bruits  Cardiac:  normal S1, S2; RRR; no murmur  Lungs:  clear to auscultation bilaterally, no wheezing, rhonchi or rales  Abd: soft, nontender, no hepatomegaly  Ext: no edema Musculoskeletal:  No deformities, BUE and BLE strength normal and equal Skin: warm and dry  Neuro:  CNs 2-12 intact, no focal abnormalities noted Psych:  Normal affect   EKG:  The  EKG was personally reviewed and demonstrates:  Sinus rhythm, normal tracing Telemetry:  Telemetry was personally reviewed and demonstrates:  Normal sinus rhythm   Laboratory Data:  Chemistry Recent Labs Lab 09/15/16 1848 09/17/16 1542  NA 137 137  K 4.0 3.5  CL 101 104  CO2 28 26  GLUCOSE 77 88  BUN 24* 14  CREATININE 0.99 0.84  CALCIUM 9.6 9.0  GFRNONAA >60 >60  GFRAA >60 >60  ANIONGAP 8 7     Recent Labs Lab 09/15/16 1848  PROT 8.4*  ALBUMIN 3.8  AST 68*  ALT 73*  ALKPHOS 78  BILITOT 0.5   Hematology Recent Labs Lab 09/15/16 1848 09/17/16 1542  WBC 8.0 5.7  RBC 4.96 4.68  HGB 13.4 12.4  HCT 40.4 39.0  MCV 81.5 83.3  MCH 27.0 26.5  MCHC 33.2 31.8  RDW 13.3 13.4  PLT 288 271   Cardiac Enzymes Recent Labs Lab 09/15/16 1848 09/17/16 1903 09/18/16 0022 09/18/16 0326  TROPONINI <0.03 <0.03 <0.03 <0.03    Recent Labs Lab 09/17/16 1557  TROPIPOC 0.00    BNPNo results for input(s): BNP, PROBNP in the last 168 hours.  DDimer No results for input(s): DDIMER in the last 168 hours.  Radiology/Studies:  Ct Angio Head W Or Wo Contrast  Result Date: 09/16/2016 CLINICAL DATA:  Acute onset moderate headache.  Nausea. EXAM: CT ANGIOGRAPHY HEAD AND NECK TECHNIQUE: Multidetector CT imaging of the head and neck was performed using the standard protocol during bolus administration of intravenous contrast. Multiplanar CT image reconstructions and MIPs were obtained to evaluate the vascular anatomy. Carotid stenosis measurements (when applicable) are obtained utilizing NASCET criteria, using the distal internal carotid diameter as the denominator. CONTRAST:  80 cc Isovue 370 COMPARISON:  CT HEAD September 15, 2016 FINDINGS: CTA NECK AORTIC ARCH: Normal appearance of the thoracic arch, 2 vessel arch is a normal variant. Mild calcific atherosclerosis. The origins of the innominate, left Common carotid artery and subclavian artery are widely patent. RIGHT CAROTID SYSTEM:  Common carotid artery is widely patent, coursing in a straight line fashion. Normal appearance of the carotid bifurcation without hemodynamically significant stenosis by NASCET criteria. Normal appearance of the included internal carotid artery. LEFT CAROTID SYSTEM: Common carotid artery is widely patent, coursing in a straight line fashion. Normal appearance of the carotid bifurcation without hemodynamically significant stenosis by NASCET criteria. Normal appearance of the included internal carotid artery. VERTEBRAL ARTERIES:Left vertebral artery is dominant. Normal appearance of the vertebral arteries, which appear widely patent. SKELETON: No acute osseous process though bone windows have not been submitted. OTHER NECK: Soft tissues of the neck are nonacute though, not tailored for evaluation. UPPER CHEST: Included lung apices are clear. Main pulmonary artery is mildly enlarged at 3.1 cm seen with chronic pulmonary arterial hypertension. No superior mediastinal lymphadenopathy. CTA HEAD- delayed phase. ANTERIOR CIRCULATION: Patent cervical internal carotid arteries, petrous, cavernous and supra clinoid internal carotid arteries. 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 Widely patent anterior communicating artery. Patent anterior and middle cerebral arteries. No large vessel occlusion, significant stenosis, contrast extravasation. POSTERIOR CIRCULATION: Patent vertebral arteries, vertebrobasilar junction and basilar artery, as well as main branch vessels. Patent posterior cerebral arteries. Tiny bilateral posterior communicating artery is present. No large vessel occlusion, significant stenosis, contrast extravasation or aneurysm. VENOUS SINUSES: Major dural venous sinuses are patent though not tailored for evaluation on this angiographic examination. ANATOMIC VARIANTS: None. DELAYED PHASE: No abnormal intracranial enhancement. Known cerebellar  tonsillar ectopia is difficult to characterize. MIP images reviewed. IMPRESSION: CTA NECK: 1. No hemodynamically significant stenosis or acute vascular process. CTA HEAD: 1. No emergent large vessel occlusion, stenosis or acute vascular process. 2. 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 Electronically Signed   By: Awilda Metroourtnay  Bloomer M.D.   On: 09/16/2016 02:44   Dg Chest 2 View  Result Date: 09/17/2016 CLINICAL DATA:  Left-sided chest pain. EXAM: CHEST  2 VIEW COMPARISON:  06/25/2012 FINDINGS: There is elevation of the right diaphragm. There is no focal parenchymal opacity.  There is no pleural effusion or pneumothorax. The heart and mediastinal contours are unremarkable. The osseous structures are unremarkable. IMPRESSION: No active cardiopulmonary disease. Electronically Signed   By: Elige Ko   On: 09/17/2016 15:27   Ct Head Wo Contrast  Result Date: 09/15/2016 CLINICAL DATA:  Headache and nausea beginning at work this afternoon. History of hypertension, hypercholesterolemia and diabetes. EXAM: CT HEAD WITHOUT CONTRAST TECHNIQUE: Contiguous axial images were obtained from the base of the skull through the vertex without intravenous contrast. COMPARISON:  None. FINDINGS: BRAIN: No intraparenchymal hemorrhage, mass effect nor midline shift. The ventricles and sulci are normal. No acute large vascular territory infarcts. No abnormal extra-axial fluid collections. Narrowed prepontine cistern. 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 VASCULAR: Unremarkable. SKULL/SOFT TISSUES: No skull fracture. No significant soft tissue swelling. ORBITS/SINUSES: The included ocular globes and orbital contents are normal.The mastoid aircells and included paranasal sinuses are well-aerated. Under pneumatized mastoid air cells. OTHER: None. IMPRESSION: 1. No acute intracranial process. 2. 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 Recommend MRI of the brain with contrast and sagittal T2 sequence. Electronically Signed   By: Awilda Metro M.D.   On: 09/15/2016 23:11   Ct Angio Neck W Or Wo Contrast  Result Date: 09/16/2016 CLINICAL DATA:  Acute onset moderate headache.  Nausea. EXAM: CT ANGIOGRAPHY HEAD AND NECK TECHNIQUE: Multidetector CT imaging of the head and neck was performed using the standard protocol during bolus administration of intravenous contrast. Multiplanar CT image reconstructions and MIPs were obtained to evaluate the vascular anatomy. Carotid stenosis measurements (when applicable) are obtained utilizing NASCET criteria, using the distal internal carotid diameter as the denominator. CONTRAST:  80 cc Isovue 370 COMPARISON:  CT HEAD September 15, 2016 FINDINGS: CTA NECK AORTIC ARCH: Normal appearance of the thoracic arch, 2 vessel arch is a normal variant. Mild calcific atherosclerosis. The origins of the innominate, left Common carotid artery and subclavian artery are widely patent. RIGHT CAROTID SYSTEM: Common carotid artery is widely patent, coursing in a straight line fashion. Normal appearance of the carotid bifurcation without hemodynamically significant stenosis by NASCET criteria. Normal appearance of the included internal carotid artery. LEFT CAROTID SYSTEM: Common carotid artery is widely patent, coursing in a straight line fashion. Normal appearance of the carotid bifurcation without hemodynamically significant stenosis by NASCET criteria. Normal appearance of the included internal carotid artery. VERTEBRAL ARTERIES:Left vertebral artery is dominant. Normal appearance of the vertebral arteries, which appear widely patent. SKELETON: No acute osseous process though bone windows have not been submitted. OTHER NECK: Soft tissues of the neck are nonacute though, not tailored for evaluation. UPPER CHEST: Included lung apices are clear. Main pulmonary artery is mildly  enlarged at 3.1 cm seen with chronic pulmonary arterial hypertension. No superior mediastinal lymphadenopathy. CTA HEAD- delayed phase. ANTERIOR CIRCULATION: Patent cervical internal carotid arteries, petrous, cavernous and supra clinoid internal carotid arteries. 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 Widely patent anterior communicating artery. Patent anterior and middle cerebral arteries. No large vessel occlusion, significant stenosis, contrast extravasation. POSTERIOR CIRCULATION: Patent vertebral arteries, vertebrobasilar junction and basilar artery, as well as main branch vessels. Patent posterior cerebral arteries. Tiny bilateral posterior communicating artery is present. No large vessel occlusion, significant stenosis, contrast extravasation or aneurysm. VENOUS SINUSES: Major dural venous sinuses are patent though not tailored for evaluation on this angiographic examination. ANATOMIC VARIANTS: None. DELAYED PHASE: No abnormal intracranial enhancement. Known  cerebellar tonsillar ectopia is difficult to characterize. MIP images reviewed. IMPRESSION: CTA NECK: 1. No hemodynamically significant stenosis or acute vascular process. CTA HEAD: 1. No emergent large vessel occlusion, stenosis or acute vascular process. 2. 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 Electronically Signed   By: Awilda Metro M.D.   On: 09/16/2016 02:44    Assessment and Plan:   1. CHEST PAIN: Atypical, but she has numerous coronary risk factors, some of them poorly controlled (diabetes mellitus), others well treated (hypercholesterolemia and hypertension) and is postmenopausal. Since she is relatively young and unlikely have severe coronary calcifications, I think the optimal study would be a coronary CT angiogram. Sequencing at this time today. If not will order a [employer].   For questions or updates, please contact [facility] Please consult [email or web address] for contact info under Cardiology/STEMI. Daytime calls, contact the Day Call APP (6a-8a) or assigned team (Teams A-D) provider (7:30a - 5p). All other daytime calls (7:30-5p), contact the Card Master @ [phone].   Nighttime calls, contact the assigned APP (5p-8p) or MD (6:30p-8p). Overnight calls (8p-6a), contact the on call Fellow @ [phone].   Signed, Thurmon Fair, MD  09/18/2016 8:43 AM

## 2016-09-18 NOTE — Discharge Summary (Signed)
Physician Discharge Summary  Leslie FlakesGail Wood ZOX:[id number] DOB: 14-Feb-1965 DOA: 09/17/2016  PCP: [name], [name], MD  Admit date: 09/17/2016 Discharge date: 09/18/2016   Recommendations for Outpatient Follow-Up:   1. Coronary CTA  Score of 0 2. Needs better diabetic control 3. Follow up of AST/ALT   Discharge Diagnosis:   Active Problems:   Substernal chest pain   Diabetes mellitus without complication (HCC)   Asthma   Hypercholesterolemia   Benign essential HTN   Discharge disposition:  Home  Discharge Condition: Improved.  Diet recommendation: Low sodium, heart healthy.  Carbohydrate-modified  Wound care: None.   History of Present Illness:   Leslie FlakesGail Turk is a 51 y.o. female with a history of HTN, HLD, IDDM who presented to the ED 9/9 with sudden onset chest pain. She describes sitting at home when she noted sudden onset, severe chest "aching" in the middle of the chest radiating to the back which worsened when she laid supine, but not necessarily better when leaning forward. It remained constant, associated with dyspnea, so she called EMS who provided NTG and ASA en route with improvement from level 10/10 to 4/10. In the ED vital signs have been stable, work up including normal labs, negative CXR, undetectable troponin, and nonischemic ECG. Based on risk factors and presentation, chest pain observation was requested.    Recently seen in ED for severe headache with negative imaging work up which has resolved.     Hospital Course by Problem:   Atypical chest pain: -HEART score 4, -CE negative -cards: coronary CTA with score of 0  Essential HTN:  - Continue home antihypertensives ARB/HCTZ  IDT2DM:  - HbA1c 11.1 -needs close outpatient follow up for better control  Hyperlipidemia: Chronic.  - Continue medium intensity statin- with close LFT follow up  Headache: Recently negative work up.  - Tylenol, ibuprofen prn.   Intermittent asthma, mild: Last MDI use  was 2 months ago - Albuterol MDI prn  Mild elevation of AST/ALT -outpatient follow up     Medical Consultants:   cards  Discharge Exam:   Vitals:   09/17/16 2001 09/18/16 0448  BP: 100/78 (!) 116/94  Pulse: 84 70  Resp: 19   Temp: 98.3 F (36.8 C) 98.2 F (36.8 C)  SpO2: 94% 98%   Vitals:   09/17/16 1800 09/17/16 1855 09/17/16 2001 09/18/16 0448  BP: 109/79 118/78 100/78 (!) 116/94  Pulse: 91 82 84 70  Resp: (!) 21 (!) 25 19   Temp:  98.2 F (36.8 C) 98.3 F (36.8 C) 98.2 F (36.8 C)  TempSrc:  Oral    SpO2: 94% 95% 94% 98%  Weight:  81.6 kg (179 lb 12.8 oz)  81.6 kg (180 lb)  Height:  5\' 5"  (1.651 m)      Gen:  NAD    The results of significant diagnostics from this hospitalization (including imaging, microbiology, ancillary and laboratory) are listed below for reference.     Procedures and Diagnostic Studies:   Dg Chest 2 View  Result Date: 09/17/2016 CLINICAL DATA:  Left-sided chest pain. EXAM: CHEST  2 VIEW COMPARISON:  06/25/2012 FINDINGS: There is elevation of the right diaphragm. There is no focal parenchymal opacity. There is no pleural effusion or pneumothorax. The heart and mediastinal contours are unremarkable. The osseous structures are unremarkable. IMPRESSION: No active cardiopulmonary disease. Electronically Signed   By: Elige KoHetal  Patel   On: 09/17/2016 15:27   Ct Coronary Morph W/cta Cor Teressa SenterW/score W/ca W/cm &/or Wo/cm  Addendum  Date: 09/18/2016   ADDENDUM REPORT: 09/18/2016 13:50 CLINICAL DATA:  66 -year-old female with atypical chest pain and risk factors including DM and hypertension. EXAM: Cardiac/Coronary  CT TECHNIQUE: The patient was scanned on a [employer]. FINDINGS: A 120 kV prospective scan was triggered in the descending thoracic aorta at 111 HU's. Axial non-contrast 3 mm slices were carried out through the heart. The data set was analyzed on a dedicated work station and scored using the Agatson method. Gantry rotation speed was  250 msecs and collimation was .6 mm. No beta blockade and 0.8 mg of sl NTG was given. The 3D data set was reconstructed in 5% intervals of the 67-82 % of the R-R cycle. Diastolic phases were analyzed on a dedicated work station using MPR, MIP and VRT modes. The patient received 80 cc of contrast. Aorta:  Normal size.  No calcifications.  No dissection. Aortic Valve:  Trileaflet.  No calcifications. Coronary Arteries:  Normal coronary origin.  Right dominance. Left main is a large artery that gives rise to LA and LCX arteries. LAD is a very large vessel that wraps around the apex and supplies PDA. 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 LCX is a small non-dominant artery that gives rise to one very small OM1 branch. There is no plaque. RCA has high origin between aorta and pulmonary artery still above the right coronary sinus but shifted to the left. There is no compression in systole. RCA is a medium size artery that gives rise to PLVB and has no plaque. Other findings: Normal pulmonary vein drainage into the left atrium. Normal let atrial appendage without a thrombus. 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 IMPRESSION: 1. Coronary calcium score of 0. This was 0 percentile for age and sex matched control. 2. Normal coronary origin with right dominance and high RCA origin between aorta and pulmonary artery but no systolic compression. 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 No atherosclerotic plaque. 3. 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 Tobias Alexander Electronically Signed   By: Tobias Alexander   On: 09/18/2016 13:50   Result Date: 09/18/2016 EXAM: OVER-READ INTERPRETATION  CT CHEST The following report is an over-read performed by radiologist Dr. Noe Gens [facility] on 09/18/2016. This over-read does not include interpretation of cardiac or coronary anatomy or pathology. The coronary CTA interpretation by the cardiologist is attached.  COMPARISON:  None. FINDINGS: Cardiovascular: Heart is normal size. Visualized aorta normal caliber. Mediastinum/Nodes: No adenopathy in the lower mediastinum or hila. Lungs/Pleura: Bibasilar atelectasis.  No effusions. Upper Abdomen: No acute findings in the upper abdomen. Musculoskeletal: Chest wall soft tissues are unremarkable. No acute bony abnormality. IMPRESSION: No acute extra cardiac abnormality. Electronically Signed: By: Charlett Nose M.D. On: 09/18/2016 12:36     Labs:   Basic Metabolic Panel:  Recent Labs Lab 09/15/16 1848 09/17/16 1542  NA 137 137  K 4.0 3.5  CL 101 104  CO2 28 26  GLUCOSE 77 88  BUN 24* 14  CREATININE 0.99 0.84  CALCIUM 9.6 9.0   GFR Estimated Creatinine Clearance: 82.6 mL/min (by C-G formula based on SCr of 0.84 mg/dL). Liver Function Tests:  Recent Labs Lab 09/15/16 1848  AST 68*  ALT 73*  ALKPHOS 78  BILITOT 0.5  PROT 8.4*  ALBUMIN 3.8   No results for input(s): LIPASE, AMYLASE in the last 168 hours. No results for input(s): AMMONIA in the last 168 hours. Coagulation profile No results  for input(s): INR, PROTIME in the last 168 hours.  CBC:  Recent Labs Lab 09/15/16 1848 09/17/16 1542  WBC 8.0 5.7  NEUTROABS 5.8  --   HGB 13.4 12.4  HCT 40.4 39.0  MCV 81.5 83.3  PLT 288 271   Cardiac Enzymes:  Recent Labs Lab 09/15/16 1848 09/17/16 1903 09/18/16 0022 09/18/16 0326  TROPONINI <0.03 <0.03 <0.03 <0.03   BNP: Invalid input(s): POCBNP CBG:  Recent Labs Lab 09/15/16 1903 09/17/16 2057 09/18/16 0736 09/18/16 0830  GLUCAP 76 236* 58* 102*   D-Dimer No results for input(s): DDIMER in the last 72 hours. Hgb A1c  Recent Labs  09/17/16 1851  HGBA1C 11.1*   Lipid Profile  Recent Labs  09/18/16 0326  CHOL 152  HDL 44  LDLCALC 93  TRIG 77  CHOLHDL 3.5   Thyroid function studies No results for input(s): TSH, T4TOTAL, T3FREE, THYROIDAB in the last 72 hours.  Invalid input(s): FREET3 Anemia work up No  results for input(s): VITAMINB12, FOLATE, FERRITIN, TIBC, IRON, RETICCTPCT in the last 72 hours. Microbiology No results found for this or any previous visit (from the past 240 hour(s)).   Discharge Instructions:   Discharge Instructions    Diet - low sodium heart healthy    Complete by:  As directed    Diet Carb Modified    Complete by:  As directed    Increase activity slowly    Complete by:  As directed      Allergies as of 09/18/2016   No Known Allergies     Medication List    TAKE these medications   acetaminophen 500 MG tablet Commonly known as:  TYLENOL Take 1,000 mg by mouth every 6 (six) hours as needed for headache (pain).   glimepiride 4 MG tablet Commonly known as:  AMARYL Take 4 mg by mouth daily before breakfast.   ibuprofen 200 MG tablet Commonly known as:  ADVIL,MOTRIN Take 400 mg by mouth every 6 (six) hours as needed for headache (pain).   losartan-hydrochlorothiazide 50-12.5 MG tablet Commonly known as:  HYZAAR Take 1 tablet by mouth daily.   ondansetron 4 MG tablet Commonly known as:  ZOFRAN Take 1 tablet (4 mg total) by mouth every 6 (six) hours as needed for nausea or vomiting.   pravastatin 20 MG tablet Commonly known as:  PRAVACHOL Take 20 mg by mouth daily.   PROAIR HFA 108 (90 Base) MCG/ACT inhaler Generic drug:  albuterol Inhale 2 puffs into the lungs every 6 (six) hours as needed for wheezing or shortness of breath.   TOUJEO SOLOSTAR 300 UNIT/ML Sopn Generic drug:  Insulin Glargine Inject 30 Units into the skin daily.            Discharge Care Instructions        Start     Ordered   09/18/16 0000  Increase activity slowly     09/18/16 1447   09/18/16 0000  Diet - low sodium heart healthy     09/18/16 1447   09/18/16 0000  Diet Carb Modified     09/18/16 1447        Time coordinating discharge: 35 min Signed:  Jeffifer Rabold U Jontez Redfield   [facility] 09/18/2016, 2:47 PM

## 2016-09-18 NOTE — Care Management Note (Addendum)
Case Management Note  Patient Details  Name: Leslie Wood MRN: [id number] Date of Birth: 1965/08/21  Subjective/Objective:   Pt presented for Chest Pain and Diabetes. Pt is without insurance at this time. Has PCP: Leslie Wood, [name], MD. CM will discuss in regards to patient if she wants to change PCP- If pt uses one of the local clinics she will be able to utilize the [facility] and cost ranges from $4.00-$10.00. Number of ED visits in last six months is 3.  Pt is currently living with her son. Pt will need all generic medications once stable for d/c.              Action/Plan: CM will continue to monitor for additional needs.    Expected Discharge Date:                  Expected Discharge Plan:  Home/Self Care  In-House Referral:  NA  Discharge planning Services  CM Consult  Post Acute Care Choice:  NA Choice offered to:  NA  DME Arranged:  N/A DME Agency:  NA  HH Arranged:  NA HH Agency:  NA  Status of Service:  Completed, signed off  If discussed at Long Length of Stay Meetings, dates discussed:    Additional Comments:  Leslie Wood, Leslie Silvernail Kaye, RN 09/18/2016, 10:17 AM

## 2016-09-20 ENCOUNTER — Encounter: Payer: Self-pay | Admitting: Neurology

## 2016-09-27 MED FILL — PRAVASTATIN NA 20 MG TAB: 20 | 90 days supply | Qty: 90 | Fill #0

## 2016-09-27 MED FILL — GLIMEPIRIDE 4 MG TABLET: 4 | 90 days supply | Qty: 90 | Fill #0

## 2016-09-27 MED FILL — LOSARTAN-HCTZ 50-12.5 MG TA: 50-12.5 | 90 days supply | Qty: 90 | Fill #0

## 2016-09-27 MED FILL — UNIFINE PENTIPS 8MM 31G: 31G X 8 MM | 90 days supply | Qty: 100 | Fill #0

## 2016-11-02 ENCOUNTER — Ambulatory Visit: Payer: Self-pay | Admitting: Neurology

## 2016-11-02 ENCOUNTER — Ambulatory Visit: Payer: Medicaid Other | Admitting: Neurology

## 2016-11-13 ENCOUNTER — Ambulatory Visit: Payer: Self-pay | Admitting: Neurology

## 2016-11-13 ENCOUNTER — Ambulatory Visit: Payer: Medicaid Other | Admitting: Neurology

## 2017-01-11 MED FILL — PRAVASTATIN NA 20 MG TAB: 20 | 16 days supply | Qty: 16 | Fill #1

## 2017-01-11 MED FILL — GLIMEPIRIDE 4 MG TABLET: 4 | 16 days supply | Qty: 16 | Fill #1

## 2017-01-11 MED FILL — TOUJEO SOLOSTAR 300 UNITS/M: 300 | 67 days supply | Qty: 5 | Fill #0

## 2017-01-11 MED FILL — UNIFINE PENTIPS 8MM 31G: 31G X 8 MM | 90 days supply | Qty: 100 | Fill #1

## 2017-01-15 ENCOUNTER — Ambulatory Visit: Payer: Self-pay | Admitting: Neurology

## 2017-01-17 ENCOUNTER — Ambulatory Visit: Payer: BLUE CROSS/BLUE SHIELD | Admitting: Neurology

## 2017-01-17 ENCOUNTER — Encounter: Payer: Self-pay | Admitting: Neurology

## 2017-01-17 VITALS — BP 146/100 | HR 76 | Ht 65.0 in | Wt 190.4 lb

## 2017-01-17 DIAGNOSIS — G44201 Tension-type headache, unspecified, intractable: Secondary | ICD-10-CM

## 2017-01-17 DIAGNOSIS — I671 Cerebral aneurysm, nonruptured: Secondary | ICD-10-CM

## 2017-01-17 DIAGNOSIS — Q048 Other specified congenital malformations of brain: Secondary | ICD-10-CM

## 2017-01-17 DIAGNOSIS — I10 Essential (primary) hypertension: Secondary | ICD-10-CM | POA: Diagnosis not present

## 2017-01-17 DIAGNOSIS — E1142 Type 2 diabetes mellitus with diabetic polyneuropathy: Secondary | ICD-10-CM

## 2017-01-17 MED ORDER — PREDNISONE 10 MG PO TABS: ORAL_TABLET | ORAL | 0 refills | Status: DC

## 2017-01-17 MED FILL — predniSONE 10 MG TABS: 10 | 6 days supply | Qty: 21 | Fill #0

## 2017-01-17 NOTE — Patient Instructions (Signed)
1.  We will check MRI of brain with and without contrast 2.  I will prescribe you a prednisone (steroid) taper to try and break this headache:  Take 6tabs x1day, then 5tabs x1day, then 4tabs x1day, then 3tabs x1day, then 2tabs x1day, then 1tab x1day, then STOP  Do not take ibuprofen while on this, as it may upset your stomach  Contact me in one week if headache not better 3.  In 6 months, I want to check MRA of head to follow up on aneurysm 4.  Follow up in 3 months.

## 2017-01-17 NOTE — Progress Notes (Signed)
NEUROLOGY CONSULTATION NOTE  [name] MRN: [id number] DOB: December 25, 1965  Referring provider: ED referral Primary care provider: Dr. Concepcion Elk  Reason for consult:  headache  HISTORY OF PRESENT ILLNESS: Leslie Wood is a 52 year old right-handed female with diabetes, hypertension, and asthma who presents for headache and PCOM aneurysm.  History supplemented by ED and hospital notes.  CT and CTA personally reviewed.  She has had headaches off and on since around age 62.  They are located bi-frontal/on top of her head, throbbing, moderate to severe intensity.  They are not associated with nausea, vomiting, photophobia, phonophobia, osmophobia or visual disturbance.  Denies neck pain.  They typically last several days and occur every few months (around every 4 months).  She presented to the ED on 09/15/16 with on of her habitual headaches.  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  She was sent home with neurology outpatient follow up.  She was admitted to the hospital a couple of days later for atypical chest pain.  Cardiac workup was negative.  Headaches had resolved soon after her hospitalization.  However, they returned this past Sunday and have persisted.  Intensity again varies.  There are no triggers.  Coughing may exacerbate it.  Rubbing her head helps.  She takes either Tylenol or ibuprofen with modest relief but headache will return.  She denies double vision, numbness, facial weakness, unilateral numbness or weakness or new/worsening headache.  She reports occasional dizziness at times and says she sometimes falls on occasion.  PAST MEDICAL HISTORY: Past Medical History:  Diagnosis Date  . Asthma   . Diabetes mellitus without complication (HCC)   . Hypercholesterolemia   . Hypertension   . Obesity     PAST SURGICAL  HISTORY: Past Surgical History:  Procedure Laterality Date  . CESAREAN SECTION    . IR REMOVAL OF CALCULI/DEBRIS BILIARY DUCT/GB      MEDICATIONS: Current Outpatient Medications on File Prior to Visit  Medication Sig Dispense Refill  . acetaminophen (TYLENOL) 500 MG tablet Take 1,000 mg by mouth every 6 (six) hours as needed for headache (pain).    [name] albuterol (PROAIR HFA) 108 (90 Base) MCG/ACT inhaler Inhale 2 puffs into the lungs every 6 (six) hours as needed for wheezing or shortness of breath.    [name] glimepiride (AMARYL) 4 MG tablet Take 4 mg by mouth daily before breakfast.    . ibuprofen (ADVIL,MOTRIN) 200 MG tablet Take 400 mg by mouth every 6 (six) hours as needed for headache (pain).     . Insulin Glargine (TOUJEO SOLOSTAR) 300 UNIT/ML SOPN Inject 30 Units into the skin daily.    [name] losartan-hydrochlorothiazide (HYZAAR) 50-12.5 MG tablet Take 1 tablet by mouth daily.   0  . ondansetron (ZOFRAN) 4 MG tablet Take 1 tablet (4 mg total) by mouth every 6 (six) hours as needed for nausea or vomiting. (Patient not taking: Reported on 09/15/2016) 12 tablet 0  . pravastatin (PRAVACHOL) 20 MG tablet Take 20 mg by mouth daily.    . SYMBICORT 160-4.5 MCG/ACT inhaler Take 1 puff by mouth as directed.  4   No current facility-administered medications on file prior to visit.     ALLERGIES: No Known Allergies  FAMILY HISTORY: Family History  Problem Relation Age of Onset  . Hypertension Mother   . Diabetes Mother   . Asthma  Mother   . High blood pressure Father   . High blood pressure Sister   . Diabetes Sister   . Asthma Sister     SOCIAL HISTORY: Social History   Socioeconomic History  . Marital status: Single    Spouse name: Not on file  . Number of children: 4  . Years of education: Not on file  . Highest education level: 12th grade  Social Needs  . Financial resource strain: Not on file  . Food insecurity - worry: Not on file  . Food insecurity - inability: Not on file  .  Transportation needs - medical: Not on file  . Transportation needs - non-medical: Not on file  Occupational History  . Occupation: substitute Magazine features editor: [employer]  Tobacco Use  . Smoking status: Never Smoker  . Smokeless tobacco: Never Used  Substance and Sexual Activity  . Alcohol use: No  . Drug use: No  . Sexual activity: Not Currently  Other Topics Concern  . Not on file  Social History Narrative   Single, lives with son and grandson in a one level home, has 7 steps to enter. Rare caffeine. Does not exercise.    REVIEW OF SYSTEMS: Constitutional: No fevers, chills, or sweats, no generalized fatigue, change in appetite Eyes: No visual changes, double vision, eye pain Ear, nose and throat: No hearing loss, ear pain, nasal congestion, sore throat Cardiovascular: No chest pain, palpitations Respiratory:  No shortness of breath at rest or with exertion, wheezes GastrointestinaI: No nausea, vomiting, diarrhea, abdominal pain, fecal incontinence Genitourinary:  No dysuria, urinary retention or frequency Musculoskeletal:  No neck pain, back pain Integumentary: No rash, pruritus, skin lesions Neurological: as above Psychiatric: No depression, insomnia, anxiety Endocrine: No palpitations, fatigue, diaphoresis, mood swings, change in appetite, change in weight, increased thirst Hematologic/Lymphatic:  No purpura, petechiae. Allergic/Immunologic: no itchy/runny eyes, nasal congestion, recent allergic reactions, rashes  PHYSICAL EXAM: Vitals:   01/17/17 0752  BP: (!) 146/100  Pulse: 76  SpO2: 95%   General: No acute distress.  Patient appears well-groomed.  Head:  Normocephalic/atraumatic Eyes:  fundi examined but not visualized Neck: supple, no paraspinal tenderness, full range of motion Back: No paraspinal tenderness Heart: regular rate and rhythm Lungs: Clear to auscultation bilaterally. Vascular: No carotid bruits. Neurological Exam: Mental  status: alert and oriented to person, place, and time, recent and remote memory intact, fund of knowledge intact, attention and concentration intact, speech fluent and not dysarthric, language intact. Cranial nerves: CN I: not tested CN II: pupils equal, round and reactive to light, visual fields intact CN III, IV, VI:  full range of motion, no nystagmus, no ptosis CN V: facial sensation intact CN VII: upper and lower face symmetric CN VIII: hearing intact CN IX, X: gag intact, uvula midline CN XI: sternocleidomastoid and trapezius muscles intact CN XII: tongue midline Bulk & Tone: normal, no fasciculations. Motor:  5/5 throughout  Sensation: temperature sensation intact and vibration sensation reduced in lower extremities. Deep Tendon Reflexes:  2+ throughout, except 3+ in patellars, toes downgoing.  Finger to nose testing:  Without dysmetria.  Heel to shin:  Without dysmetria.  Gait:  Normal station and stride.  Able to turn but difficulty with tandem walk. Romberg negative.  IMPRESSION: 1.  Habitual headache.  Infrequent but intractable.  May be tension type headache.   2.  Pcom aneurysm.  Incidental finding and not contributing to headaches, as she has had these headaches for over 10  years.  Based on size and location, there is a 2.5 % risk of rupture over 5 years.  Based on size and since she has no prior history of SAH and it does not seem symptomatic, intervention is likely not to be beneficial.  Will continue to monitor. 3.  Cerebellar tonsillar ectopia.  She does not really describe symptoms of Chiari, except for increased pain with coughing which may be seen in tension type headache as well. 4.  HTN.  Elevated blood pressure today.  She held her losartan due to concerns of cancer. 5.  Probable diabetic neuropathy.   6.  Hyperreflexia in patellars only.  No other exam findings to suggest cord involvement.    PLAN: 1.  Given that headaches are infrequent, we will defer starting  daily preventative and treat acutely.  Prednisone taper prescribed.  She will contact me in a week and we can start a preventative (such as nortriptyline) if headache not resolved. 2.  Check MRI of brain with and without contrast to evaluate cerebellar tonsillar ectopia. 3.  Check MRA of head in 6 months to follow up on Pcom aneurysm 4.  Contact PCP and follow up regarding blood pressure. 5.  Follow up with me in 3 months.  Thank you for allowing me to take part in the care of this patient.  Shon MilletAdam Jaffe, DO  CC:  Fleet ContrasEdwin Avbuere, MD

## 2017-02-06 ENCOUNTER — Other Ambulatory Visit: Payer: Self-pay | Admitting: Internal Medicine

## 2017-02-06 DIAGNOSIS — Z1231 Encounter for screening mammogram for malignant neoplasm of breast: Secondary | ICD-10-CM

## 2017-02-15 ENCOUNTER — Ambulatory Visit
Admission: RE | Admit: 2017-02-15 | Discharge: 2017-02-15 | Disposition: A | Discharge status: Home / Self Care | Payer: BLUE CROSS/BLUE SHIELD | Source: Ambulatory Visit | Attending: Neurology | Admitting: Neurology

## 2017-02-15 ENCOUNTER — Ambulatory Visit: Payer: Self-pay | Admitting: Neurology

## 2017-02-15 ENCOUNTER — Telehealth: Payer: Self-pay

## 2017-02-15 DIAGNOSIS — Q048 Other specified congenital malformations of brain: Secondary | ICD-10-CM

## 2017-02-15 MED ORDER — GADOBENATE DIMEGLUMINE 529 MG/ML IV SOLN
18.0000 mL | Freq: Once | INTRAVENOUS | Status: AC | PRN
  Administered 2017-02-15: 18 mL via INTRAVENOUS

## 2017-02-15 NOTE — Telephone Encounter (Signed)
-----   Message from Drema DallasAdam R Jaffe, DO sent at 02/15/2017 12:58 PM EST ----- I called Ms. Leslie Wood and explained the MRI results.  I would like to refer her to neurosurgery for evaluation.  She knows to expect a call to make an appointment.

## 2017-02-15 NOTE — Telephone Encounter (Signed)
[facility], need to fax notes for referral, faxing info.

## 2017-02-26 MED FILL — TOUJEO SOLOSTAR 300 UNITS/M: 300 | 30 days supply | Qty: 3 | Fill #0

## 2017-02-28 ENCOUNTER — Telehealth: Payer: Self-pay | Admitting: Neurology

## 2017-02-28 NOTE — Telephone Encounter (Signed)
Patient called to check the status on the referral to the Neurosurgeon? Please Call. Thanks

## 2017-02-28 NOTE — Telephone Encounter (Signed)
[facility], spoke with Liborio NixonJanice. I asked referral status, she said they tried to reach Pt, waiting on return call. Called Pt, provided her the number to call and make appt.

## 2017-03-01 NOTE — Telephone Encounter (Signed)
Rcvd fax from [facility], Pt has appt 04/02/17 @ 9:15a with Dr Venetia MaxonStern

## 2017-03-03 ENCOUNTER — Emergency Department (HOSPITAL_COMMUNITY): Payer: BLUE CROSS/BLUE SHIELD

## 2017-03-03 ENCOUNTER — Encounter (HOSPITAL_COMMUNITY): Payer: Self-pay | Admitting: Nurse Practitioner

## 2017-03-03 ENCOUNTER — Emergency Department (HOSPITAL_COMMUNITY)
Admission: EM | Admit: 2017-03-03 | Discharge: 2017-03-04 | Disposition: A | Discharge status: Home / Self Care | Payer: BLUE CROSS/BLUE SHIELD | Attending: Emergency Medicine | Admitting: Emergency Medicine

## 2017-03-03 DIAGNOSIS — R000 Tachycardia, unspecified: Secondary | ICD-10-CM

## 2017-03-03 DIAGNOSIS — Z8669 Personal history of other diseases of the nervous system and sense organs: Secondary | ICD-10-CM | POA: Insufficient documentation

## 2017-03-03 DIAGNOSIS — R51 Headache: Secondary | ICD-10-CM

## 2017-03-03 DIAGNOSIS — E119 Type 2 diabetes mellitus without complications: Secondary | ICD-10-CM | POA: Insufficient documentation

## 2017-03-03 DIAGNOSIS — Z794 Long term (current) use of insulin: Secondary | ICD-10-CM | POA: Diagnosis not present

## 2017-03-03 DIAGNOSIS — Z79899 Other long term (current) drug therapy: Secondary | ICD-10-CM | POA: Diagnosis not present

## 2017-03-03 DIAGNOSIS — R519 Headache, unspecified: Secondary | ICD-10-CM

## 2017-03-03 DIAGNOSIS — J45909 Unspecified asthma, uncomplicated: Secondary | ICD-10-CM | POA: Diagnosis not present

## 2017-03-03 DIAGNOSIS — I10 Essential (primary) hypertension: Secondary | ICD-10-CM | POA: Insufficient documentation

## 2017-03-03 LAB — I-STAT TROPONIN, ED: TROPONIN I, POC: 0 ng/mL (ref 0.00–0.08)

## 2017-03-03 LAB — COMPREHENSIVE METABOLIC PANEL
ALBUMIN: 3.9 g/dL (ref 3.5–5.0)
ALT: 32 U/L (ref 14–54)
ANION GAP: 11 (ref 5–15)
AST: 36 U/L (ref 15–41)
Alkaline Phosphatase: 98 U/L (ref 38–126)
BILIRUBIN TOTAL: 0.4 mg/dL (ref 0.3–1.2)
BUN: 18 mg/dL (ref 6–20)
CHLORIDE: 102 mmol/L (ref 101–111)
CO2: 24 mmol/L (ref 22–32)
Calcium: 9.2 mg/dL (ref 8.9–10.3)
Creatinine, Ser: 0.97 mg/dL (ref 0.44–1.00)
GFR calc Af Amer: >60 mL/min (ref >60)
GFR calc non Af Amer: >60 mL/min (ref >60)
GLUCOSE: 283 mg/dL — AB (ref 65–99)
POTASSIUM: 3.9 mmol/L (ref 3.5–5.1)
SODIUM: 137 mmol/L (ref 135–145)
TOTAL PROTEIN: 8.2 g/dL — AB (ref 6.5–8.1)

## 2017-03-03 LAB — CBC
HCT: 42.5 % (ref 36.0–46.0)
Hemoglobin: 14.1 g/dL (ref 12.0–15.0)
MCH: 27.5 pg (ref 26.0–34.0)
MCHC: 33.2 g/dL (ref 30.0–36.0)
MCV: 83 fL (ref 78.0–100.0)
PLATELETS: 326 10*3/uL (ref 150–400)
RBC: 5.12 MIL/uL — ABNORMAL HIGH (ref 3.87–5.11)
RDW: 13 % (ref 11.5–15.5)
WBC: 9 10*3/uL (ref 4.0–10.5)

## 2017-03-03 MED ORDER — ACETAMINOPHEN 500 MG PO TABS
1000.0000 mg | ORAL_TABLET | Freq: Once | ORAL | Status: AC
  Administered 2017-03-03: 1000 mg via ORAL
  Filled 2017-03-03: qty 2

## 2017-03-03 MED ORDER — LORAZEPAM 2 MG/ML IJ SOLN
0.5000 mg | Freq: Once | INTRAMUSCULAR | Status: AC
  Administered 2017-03-03: 0.5 mg via INTRAVENOUS
  Filled 2017-03-03: qty 1

## 2017-03-03 MED ORDER — KETOROLAC TROMETHAMINE 30 MG/ML IJ SOLN
30.0000 mg | Freq: Once | INTRAMUSCULAR | Status: DC
Start: 2017-03-03 — End: 2017-03-04

## 2017-03-03 MED ORDER — HYDRALAZINE HCL 20 MG/ML IJ SOLN
10.0000 mg | Freq: Once | INTRAMUSCULAR | Status: AC
  Administered 2017-03-03: 10 mg via INTRAVENOUS
  Filled 2017-03-03: qty 1

## 2017-03-03 MED ORDER — SODIUM CHLORIDE 0.9 % IV BOLUS (SEPSIS): 1000.0000 mL | Freq: Once | INTRAVENOUS | Status: DC

## 2017-03-03 MED ORDER — PROMETHAZINE HCL 25 MG/ML IJ SOLN
25.0000 mg | Freq: Once | INTRAMUSCULAR | Status: DC
  Filled 2017-03-03: qty 1

## 2017-03-03 NOTE — ED Notes (Signed)
Bed: WA25 Expected date:  Expected time:  Means of arrival:  Comments: 

## 2017-03-03 NOTE — ED Provider Notes (Signed)
[facility]-EMERGENCY DEPT Provider Note   CSN: [id number] Arrival date & time: 03/03/17  1919     History   Chief Complaint Chief Complaint  Patient presents with  . Headache  . Shortness of Breath    HPI Leslie Wood is a 52 y.o. female with a h/o of left PCOM 2mm aneurysm, HTN, DM Type II, obesity, hypercholesteremia, asthma, who presents to the emergency department with a chief complaint of headache with associated dyspnea, chest pain, and elevated blood pressure.  She reports a constant headache that began yesterday that presented just like 1 of her "usual" headaches that has had for the last 12 years: throbbing and located over her bilateral forehead and on the top of her head.  She rates the headache as severe, which has been constant since yesterday. She treated her symptoms with Aleve earlier today without improvement.  Denies dizziness, lightheadedness, visual changes, photophobia, nausea, or vomiting.  She reports that she came to the ED for evaluation after she developed dyspnea today with associated chest tightness and generalized weakenss. Dyspnea has been intermittent, but worsening. Tightness is non-radiating.  No aggravating or alleviating factors.  She denies leg swelling, palpitations, back pain, numbness, jaw or arm pain, or syncope.  She was previously taking losartan-HCTZ that she stopped taking after seeing commercials on TV stated that the medication could cause cancer.  No current blood pressure medication. She does not check her glucose as home because she does not have a glucometer.   She was last seen by Dr. 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  She will also have a repeat MRI of the head in 6 months to follow-up on Pcom aneurysm.   The history is provided by the patient. No language interpreter was used.    Past Medical History:  Diagnosis Date  . Asthma   .  Diabetes mellitus without complication (HCC)   . Hypercholesterolemia   . Hypertension   . Obesity     Patient Active Problem List   Diagnosis Date Noted  . Substernal chest pain 09/17/2016  . Diabetes mellitus without complication (HCC) 09/17/2016  . Asthma 09/17/2016  . Hypercholesterolemia 09/17/2016  . Benign essential HTN 09/17/2016    Past Surgical History:  Procedure Laterality Date  . CESAREAN SECTION    . IR REMOVAL OF CALCULI/DEBRIS BILIARY DUCT/GB      OB History    No data available       Home Medications    Prior to Admission medications   Medication Sig Start Date End Date Taking? Authorizing Provider  acetaminophen (TYLENOL) 500 MG tablet Take 1,000 mg by mouth every 6 (six) hours as needed for headache (pain).   Yes [provider]  albuterol (PROAIR HFA) 108 (90 Base) MCG/ACT inhaler Inhale 2 puffs into the lungs every 6 (six) hours as needed for wheezing or shortness of breath.   Yes [provider]  glimepiride (AMARYL) 4 MG tablet Take 4 mg by mouth daily before breakfast.   Yes [provider]  ibuprofen (ADVIL,MOTRIN) 200 MG tablet Take 400 mg by mouth every 6 (six) hours as needed for headache (pain).    Yes [provider]  Insulin Glargine (TOUJEO SOLOSTAR) 300 UNIT/ML SOPN Inject 30 Units into the skin daily.   Yes [provider]  losartan-hydrochlorothiazide (HYZAAR) 50-12.5 MG tablet Take 1 tablet by mouth daily.  09/04/16  Yes [provider]  pravastatin (  PRAVACHOL) 20 MG tablet Take 20 mg by mouth daily.   Yes [provider]  SYMBICORT 160-4.5 MCG/ACT inhaler Inhale 2 puffs into the lungs as needed (Shortness of breath).  01/11/17  Yes [provider]  ondansetron (ZOFRAN) 4 MG tablet Take 1 tablet (4 mg total) by mouth every 6 (six) hours as needed for nausea or vomiting. Patient not taking: Reported on 09/15/2016 07/06/13   Dione Booze, MD  predniSONE (DELTASONE) 10 MG tablet  Take 6tabs x1day, then 5tabs x1day, then 4tabs x1day, then 3tabs x1day, then 2tabs x1day, then 1tab x1day, then STOP Patient not taking: Reported on 03/03/2017 01/17/17   Drema Dallas, DO    Family History Family History  Problem Relation Age of Onset  . Hypertension Mother   . Diabetes Mother   . Asthma Mother   . High blood pressure Father   . High blood pressure Sister   . Diabetes Sister   . Asthma Sister     Social History Social History   Tobacco Use  . Smoking status: Never Smoker  . Smokeless tobacco: Never Used  Substance Use Topics  . Alcohol use: No  . Drug use: No     Allergies   Patient has no known allergies.   Review of Systems Review of Systems  Constitutional: Negative for activity change, chills and fever.  HENT: Negative for congestion.   Respiratory: Positive for shortness of breath. Negative for cough.   Cardiovascular: Positive for chest pain. Negative for leg swelling.  Gastrointestinal: Negative for abdominal pain, diarrhea, nausea and vomiting.  Genitourinary: Negative for dysuria.  Musculoskeletal: Negative for back pain, neck pain and neck stiffness.  Skin: Negative for rash.  Allergic/Immunologic: Negative for immunocompromised state.  Neurological: Positive for weakness (generalized) and headaches. Negative for dizziness, syncope and numbness.  Psychiatric/Behavioral: Negative for confusion.     Physical Exam Updated Vital Signs BP (!) 140/92   Pulse (!) 114   Temp 97.8 F (36.6 C) (Oral)   Resp 14   LMP 05/22/2012 Comment: negative pregnancy test result 09-15-16  SpO2 95%   Physical Exam  Constitutional: She is oriented to person, place, and time.  Non-toxic appearance. She does not appear ill. No distress.  Obese female.  HENT:  Head: Normocephalic.  Eyes: Conjunctivae and EOM are normal. Pupils are equal, round, and reactive to light. No scleral icterus.  Neck: Normal range of motion. Neck supple. No JVD present.    Cardiovascular: Normal rate, regular rhythm, normal heart sounds and intact distal pulses. Exam reveals no gallop and no friction rub.  No murmur heard. Pulmonary/Chest: Effort normal and breath sounds normal. No stridor. No respiratory distress. She has no wheezes. She has no rales. She exhibits no tenderness.  Abdominal: Soft. Bowel sounds are normal. She exhibits no distension and no mass. There is no tenderness. There is no rebound and no guarding. No hernia.  Musculoskeletal: Normal range of motion. She exhibits no edema or deformity.  Lymphadenopathy:    She has no cervical adenopathy.  Neurological: She is alert and oriented to person, place, and time.  Cranial nerves 2-12 grossly intact. Finger-to-nose is normal bilaterally. 5/5 motor strength of the bilateral upper and lower extremities. Moves all four extremities. Negative Romberg.  No pronator drift.  Symmetric tandem gait.. NVI.    Skin: Skin is warm. Capillary refill takes less than 2 seconds. No rash noted. No erythema. No pallor.  Psychiatric: Her behavior is normal.  Nursing note and vitals reviewed.  ED Treatments / Results  Labs (all labs ordered are listed, but only abnormal results are displayed) Labs Reviewed  CBC - Abnormal; Notable for the following components:      Result Value   RBC 5.12 (*)    All other components within normal limits  COMPREHENSIVE METABOLIC PANEL - Abnormal; Notable for the following components:   Glucose, Bld 283 (*)    Total Protein 8.2 (*)    All other components within normal limits  URINALYSIS, ROUTINE W REFLEX MICROSCOPIC  I-STAT TROPONIN, ED  I-STAT TROPONIN, ED    EKG  EKG Interpretation  Date/Time:  Saturday March 03 2017 19:40:29 EST Ventricular Rate:  94 PR Interval:    QRS Duration: 78 QT Interval:  372 QTC Calculation: 466 R Axis:   67 Text Interpretation:  Sinus rhythm Probable left atrial enlargement Abnormal R-wave progression, early transition new t wave  inversion aVL compared with 9/18 Confirmed by Meridee Score [id number]) on 03/03/2017 8:35:09 PM       Radiology Dg Chest 2 View  Result Date: 03/03/2017 CLINICAL DATA:  Shortness of breath and cough for 2 days. EXAM: CHEST  2 VIEW COMPARISON:  09/17/2016 FINDINGS: The heart size and mediastinal contours are within normal limits. Elevation of right hemidiaphragm remains stable. Low lung volumes are again seen. No evidence of pulmonary infiltrate or pleural effusion. IMPRESSION: Stable elevation of right hemidiaphragm.  No active disease. Electronically Signed   By: Myles Rosenthal M.D.   On: 03/03/2017 21:27   Ct Head Wo Contrast  Result Date: 03/03/2017 CLINICAL DATA:  Headache EXAM: CT HEAD WITHOUT CONTRAST TECHNIQUE: Contiguous axial images were obtained from the base of the skull through the vertex without intravenous contrast. COMPARISON:  MRI 02/15/2017, CT brain 09/15/2016 FINDINGS: Brain: No acute territorial infarction, hemorrhage or intracranial mass is visualized. Inferior herniation of the cerebellar tonsils as before. The ventricles are nonenlarged. Vascular: No hyperdense vessel or unexpected calcification. Skull: Normal. Negative for fracture or focal lesion. Sinuses/Orbits: Mucosal thickening in the ethmoid sinuses. Small fluid level in the left maxillary sinus. Other: None IMPRESSION: 1. No CT evidence for acute intracranial abnormality 2. Cerebellar tonsillar ectopia Electronically Signed   By: Jasmine Pang M.D.   On: 03/03/2017 21:41    Procedures Procedures (including critical care time)  Medications Ordered in ED Medications  sodium chloride 0.9 % bolus 1,000 mL (not administered)  HYDROmorphone (DILAUDID) injection 1 mg (not administered)  hydrALAZINE (APRESOLINE) injection 10 mg (10 mg Intravenous Given 03/03/17 1000)  acetaminophen (TYLENOL) tablet 1,000 mg (1,000 mg Oral Given 03/03/17 2101)  LORazepam (ATIVAN) injection 0.5 mg (0.5 mg Intravenous Given 03/03/17 2155)      Initial Impression / Assessment and Plan / ED Course  I have reviewed the triage vital signs and the nursing notes.  Pertinent labs & imaging results that were available during my care of the patient were reviewed by me and considered in my medical decision making (see chart for details).  Clinical Course as of Mar 04 121  Sat Mar 03, 2017  [id number] 52 year old female with history of aneurysm here with headache and shortness of breath.  When I saw the patient she just returned from CT and she said she was feeling extremely short of breath.  Her lungs are clear her pulse ox was normal but she was tachycardic to the 140s.  I reviewed her chest x-ray and there was no acute findings.  Her CT also looks preliminary negative to me although it has not  been read by radiology yet.  [MB]  2151 Patient recheck.  She is feeling very short of breath. HR increased from 80s to 140s.  She continues to feel short of breath.  Lungs are clear to auscultation.  Pulse ox 97%. The patient was also evaluated with Dr. Charm BargesButler. Will order 0.5 mg of ativan.   [MM]  2347 Repeat EKG: sinus tachycardia, no acute st/ts   [MB]  2356 Patient recheck. She has no chest pain or dyspnea. HR 119-120s.   [MM]    Clinical Course User Index [MB] Terrilee FilesButler, Michael C, MD [MM] Shaundra Fullam, Coral ElseMia A, PA-C    52 year old female with a h/o of left PCOM 2mm aneurysm, HTN, DM Type II, obesity, hypercholesteremia, asthma who presents to the emergency department with elevated blood pressure, headache, shortness of breath, chest tightness, and generalized weakness.  She is not currently taking any hypertensive medications at home.  CT head is negative.  Chest x-ray is clear.  Initial EKG with no acute changes from previous. Labs reviewed, elevated glucose to 283, but otherwise unremarkable including negative troponin. UA pending. BP 209/120 on arrival. Hydralazine 10 mg given. Tylenol given for headache.  Patient was seen and evaluated with Dr.  Charm BargesButler, attending physician.  On reexamination, the patient has just returned chest x-ray. BP improving to 161/110, but HR increased from 80s on arrival to 140s.  No contrast or other medications were given at that time.  Patient reports that dyspnea and chest tightness has resolved, but headache has persisted.  Dilaudid ordered for HA. The patient was reevaluated several more times, and heart rate continued to improve to 119-120s.  No further antihypertensives were required as the patient's manual BP 138/98.  She is currently receiving an IV fluid bolus.  Doubt adrenal insufficiency. Reviewing previous labs, it is possible the patient could be dehydrated, as CBC appears mildly hemoconcentrated from previous.  Tachycardia could be secondary to dilation of blood vessels following hydralazine administration.  Will plan to reevaluate the patient after she has received IV fluid bolus. Patient care transferred to PA Harris at the end of my shift. Patient presentation, ED course, and plan of care discussed with review of all pertinent labs and imaging. Please see his/her note for further details regarding further ED course and disposition.  Final Clinical Impressions(s) / ED Diagnoses   Final diagnoses:  Hypertension, unspecified type  Tachycardia  Bad headache    ED Discharge Orders    None       Barkley BoardsMcDonald, Mackinley Cassaday A, PA-C 03/04/17 0123    Terrilee FilesButler, Michael C, MD 03/05/17 [phone]

## 2017-03-03 NOTE — ED Triage Notes (Signed)
Pt is c/o severe HA and shortness of breath. Reports symptoms have been ongoing is under care of neurology and is due to see a neurosurgeon but the HA is unbearable and persistent since yesterday.

## 2017-03-04 LAB — URINALYSIS, ROUTINE W REFLEX MICROSCOPIC
Bilirubin Urine: NEGATIVE
Glucose, UA: 150 mg/dL — AB
Hgb urine dipstick: NEGATIVE
KETONES UR: NEGATIVE mg/dL
LEUKOCYTES UA: NEGATIVE
NITRITE: NEGATIVE
PROTEIN: NEGATIVE mg/dL
Specific Gravity, Urine: 1.019 (ref 1.005–1.030)
pH: 6 (ref 5.0–8.0)

## 2017-03-04 LAB — I-STAT TROPONIN, ED: TROPONIN I, POC: 0 ng/mL (ref 0.00–0.08)

## 2017-03-04 MED ORDER — DEXAMETHASONE SODIUM PHOSPHATE 10 MG/ML IJ SOLN
10.0000 mg | Freq: Once | INTRAMUSCULAR | Status: AC
  Administered 2017-03-04: 10 mg via INTRAVENOUS
  Filled 2017-03-04: qty 1

## 2017-03-04 MED ORDER — KETOROLAC TROMETHAMINE 30 MG/ML IJ SOLN
30.0000 mg | Freq: Once | INTRAMUSCULAR | Status: AC
  Administered 2017-03-04: 30 mg via INTRAVENOUS
  Filled 2017-03-04: qty 1

## 2017-03-04 MED ORDER — PROCHLORPERAZINE EDISYLATE 5 MG/ML IJ SOLN
10.0000 mg | Freq: Once | INTRAMUSCULAR | Status: AC
  Administered 2017-03-04: 10 mg via INTRAVENOUS
  Filled 2017-03-04: qty 2

## 2017-03-04 MED ORDER — DIPHENHYDRAMINE HCL 50 MG/ML IJ SOLN
25.0000 mg | Freq: Once | INTRAMUSCULAR | Status: AC
  Administered 2017-03-04: 25 mg via INTRAVENOUS
  Filled 2017-03-04: qty 1

## 2017-03-04 MED ORDER — HYDROMORPHONE HCL 1 MG/ML IJ SOLN
1.0000 mg | Freq: Once | INTRAMUSCULAR | Status: AC
  Administered 2017-03-04: 1 mg via INTRAVENOUS
  Filled 2017-03-04: qty 1

## 2017-03-04 NOTE — ED Provider Notes (Signed)
I assumed care of the patient at shift change from PA McDonald. Patient with hx of Chiari malformation with tonsillar herniation and known  2 mm aneurysm. No evidence of bleed on ct. Awaiting pain control and normalization of VS.  Patient with continued headache.  I spoke with Dr. Amada JupiterKirkpatrick on for neurology.  I reviewed the case with him and imaging.  He states that she is a very small aneurysm and does not appear to be from a bleed.  He suggests continued pain control.  He states that this is very likely from her Chiari malformation and will not fully resolve here tonight.   5:29 AM BP (!) 136/98 (BP Location: Left Arm)   Pulse (!) 103   Temp 97.8 F (36.6 C) (Oral)   Resp 11   LMP 05/22/2012 Comment: negative pregnancy test result 09-15-16  SpO2 94%  She has no chest pain, no shortness of breath.  Her headache has improved to 6 out of 10 is not fully resolved.  Her vital signs have improved markedly from previous.  Patient is advised to follow-up with her neurologist and her neurosurgeon.  She appears appropriate for discharge at this time  Clinical Course as of Mar 05 523  Sat Mar 03, 2017  [id number] 52 year old female with history of aneurysm here with headache and shortness of breath.  When I saw the patient she just returned from CT and she said she was feeling extremely short of breath.  Her lungs are clear her pulse ox was normal but she was tachycardic to the 140s.  I reviewed her chest x-ray and there was no acute findings.  Her CT also looks preliminary negative to me although it has not been read by radiology yet.  [MB]  2151 Patient recheck.  She is feeling very short of breath. HR increased from 80s to 140s.  She continues to feel short of breath.  Lungs are clear to auscultation.  Pulse ox 97%. The patient was also evaluated with Dr. Charm BargesButler. Will order 0.5 mg of ativan.   [MM]  2347 Repeat EKG: sinus tachycardia, no acute st/ts   [MB]  2356 Patient recheck. She has no chest pain or  dyspnea. HR 119-120s.   [MM]    Clinical Course User Index [MB] Terrilee FilesButler, Michael C, MD [MM] McDonald, Gwenevere GhaziMia A, PA-C         Rajanae Mantia, PA-C 03/04/17 0530    Shon BatonHorton, Courtney F, MD 03/05/17 [phone]

## 2017-03-04 NOTE — Discharge Instructions (Signed)

## 2017-03-12 ENCOUNTER — Ambulatory Visit: Payer: BLUE CROSS/BLUE SHIELD

## 2017-03-13 MED FILL — ONDANSETRON ODT 4 MG TABLET: 4 | 6 days supply | Qty: 20 | Fill #0

## 2017-03-13 MED FILL — CIPROFLOXACIN HCL 250 MG TA: 250 | 7 days supply | Qty: 14 | Fill #0

## 2017-03-13 MED FILL — GLIMEPIRIDE 4 MG TABLET: 4 | 30 days supply | Qty: 30 | Fill #0

## 2017-03-13 MED FILL — PRAVASTATIN SODIUM 20 MG TA: 20 | 30 days supply | Qty: 30 | Fill #0

## 2017-03-13 MED FILL — CONTOUR NEXT STRIPS: 83 days supply | Qty: 250 | Fill #0

## 2017-03-13 MED FILL — SYMBICORT 160-4.5 MCG INH: 160-4.5 | 30 days supply | Qty: 10 | Fill #0

## 2017-03-13 MED FILL — MICROLET LANCETS: 66 days supply | Qty: 200 | Fill #0

## 2017-03-13 MED FILL — LOSARTAN-HCTZ 50-12.5 MG TA: 50-12.5 | 30 days supply | Qty: 30 | Fill #0

## 2017-03-13 MED FILL — DIPHENOXYLATE-ATROP 2.5-0.0: 2.5-0.025 | 5 days supply | Qty: 20 | Fill #0

## 2017-03-13 MED FILL — CONTOUR NEXT EZ METER: W/DEVICE | 1 days supply | Qty: 1 | Fill #0

## 2017-03-28 MED FILL — TOUJEO SOLOSTAR 300 UNITS/M: 300 | 30 days supply | Qty: 3 | Fill #1

## 2017-04-06 ENCOUNTER — Ambulatory Visit
Admission: RE | Admit: 2017-04-06 | Discharge: 2017-04-06 | Disposition: A | Discharge status: Home / Self Care | Payer: BLUE CROSS/BLUE SHIELD | Source: Ambulatory Visit | Attending: Internal Medicine | Admitting: Internal Medicine

## 2017-04-06 DIAGNOSIS — Z1231 Encounter for screening mammogram for malignant neoplasm of breast: Secondary | ICD-10-CM

## 2017-04-12 MED FILL — SYMBICORT 160-4.5 MCG INH: 160-4.5 | 30 days supply | Qty: 10 | Fill #1

## 2017-04-12 MED FILL — GLIMEPIRIDE 4 MG TABLET: 4 | 30 days supply | Qty: 30 | Fill #1

## 2017-04-12 MED FILL — PROAIR HFA 90 MCG INHALER: 108 (90 BAS | 25 days supply | Qty: 9 | Fill #0

## 2017-04-12 MED FILL — LOSARTAN-HCTZ 50-12.5 MG TA: 50-12.5 | 30 days supply | Qty: 30 | Fill #1

## 2017-04-12 MED FILL — PRAVASTATIN SODIUM 20 MG TA: 20 | 30 days supply | Qty: 30 | Fill #1

## 2017-04-17 ENCOUNTER — Ambulatory Visit: Payer: BLUE CROSS/BLUE SHIELD | Admitting: Neurology

## 2017-04-17 ENCOUNTER — Encounter: Payer: Self-pay | Admitting: Neurology

## 2017-04-17 VITALS — BP 110/80 | HR 83 | Ht 65.0 in | Wt 196.1 lb

## 2017-04-17 DIAGNOSIS — R51 Headache: Secondary | ICD-10-CM

## 2017-04-17 DIAGNOSIS — G935 Compression of brain: Secondary | ICD-10-CM

## 2017-04-17 DIAGNOSIS — I671 Cerebral aneurysm, nonruptured: Secondary | ICD-10-CM

## 2017-04-17 DIAGNOSIS — R519 Headache, unspecified: Secondary | ICD-10-CM

## 2017-04-17 MED ORDER — TOPIRAMATE ER 50 MG PO CAP24: 1.0000 | ORAL_CAPSULE | Freq: Every day | ORAL | 2 refills | Status: AC

## 2017-04-17 NOTE — Progress Notes (Signed)
NEUROLOGY FOLLOW UP OFFICE NOTE  Leslie Wood [id number]  HISTORY OF PRESENT ILLNESS: Leslie Wood is a 52 year old right-handed female with diabetes, hypertension, and asthma who follows up for headache and PCOM aneurysm.  History supplemented by ED note.  UPDATE: In January, she was prescribed a prednisone taper to break intractable headache.  It helped.  She gets the headaches about every other day lasting just a couple of minutes. 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  She was referred to neurosurgery.  She is to follow up next month to make a decision about surgery.  Due to continued headache, she returned to the ED on 03/03/17.  CT of head demonstrated no acute intracranial abnormality such as bleed.  Intensity:  Moderate to severe Duration:  2 minutes  Frequency:  Every other day Current NSAIDS:  no Current analgesics:  Tylenol Current triptans:  no Current anti-emetic:  Zofran Current muscle relaxants:  no Current anti-anxiolytic:  no Current sleep aide:  no Current Antihypertensive medications:  Hyzaar Current Antidepressant medications:  no Current Anticonvulsant medications:  no Current Vitamins/Herbal/Supplements:  no Current Antihistamines/Decongestants:  no Other therapy:  no   HISTORY: She has had headaches off and on since around age 52.  They are located bi-frontal/on top of her head, throbbing, moderate to severe intensity.  They are not associated with nausea, vomiting, photophobia, phonophobia, osmophobia or visual disturbance.  Denies neck pain.  They typically last several days and occur every few months (around every 4 months).  She presented to the ED on 09/15/16 with on of her habitual headaches.  CT of head  without contrast showed cerebellar tonsillar ectopia but no acute process.  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  She was sent home with neurology outpatient follow up.  She was admitted to the hospital a couple of days later for atypical chest pain.  Cardiac workup was negative.   Headaches had resolved soon after her hospitalization.  However, they returned this past Sunday and have persisted.  Intensity again varies.  There are no triggers.  Coughing may exacerbate it.  Rubbing her head helps.  She takes either Tylenol or ibuprofen with modest relief but headache will return.  She denies double vision, numbness, facial weakness, unilateral numbness or weakness or new/worsening headache.  She reports occasional dizziness at times and says she sometimes falls on occasion.  PAST MEDICAL HISTORY: Past Medical History:  Diagnosis Date  . Asthma   . Diabetes mellitus without complication (HCC)   . Hypercholesterolemia   . Hypertension   . Obesity     MEDICATIONS: Current Outpatient Medications on File Prior to Visit  Medication Sig Dispense Refill  . acetaminophen (TYLENOL) 500 MG tablet Take 1,000 mg by mouth every 6 (six) hours as needed for headache (pain).    [name]. albuterol (PROAIR HFA) 108 (90 Base) MCG/ACT inhaler Inhale 2 puffs into the lungs every 6 (six) hours as needed for wheezing or shortness of breath.    [name]. glimepiride (AMARYL) 4 MG tablet Take 4 mg by mouth daily before breakfast.    . ibuprofen (ADVIL,MOTRIN) 200 MG tablet Take 400 mg by mouth every 6 (six) hours as needed for headache (pain).     .[name]  Insulin Glargine (TOUJEO SOLOSTAR) 300 UNIT/ML SOPN Inject 30 Units into the skin daily.    [name] losartan-hydrochlorothiazide (HYZAAR) 50-12.5 MG tablet Take 1 tablet by mouth daily.   0  . ondansetron (ZOFRAN) 4 MG tablet Take 1 tablet (4 mg total) by mouth every 6 (six) hours as needed for nausea or vomiting. 12  tablet 0  . pravastatin (PRAVACHOL) 20 MG tablet Take 20 mg by mouth daily.    . SYMBICORT 160-4.5 MCG/ACT inhaler Inhale 2 puffs into the lungs as needed (Shortness of breath).   4   No current facility-administered medications on file prior to visit.     ALLERGIES: No Known Allergies  FAMILY HISTORY: Family History  Problem Relation Age of Onset  . Hypertension Mother   . Diabetes Mother   . Asthma Mother   . High blood pressure Father   . High blood pressure Sister   . Diabetes Sister   . Asthma Sister     SOCIAL HISTORY: Social History   Socioeconomic History  . Marital status: Single    Spouse name: Not on file  . Number of children: 4  . Years of education: Not on file  . Highest education level: 12th grade  Occupational History  . Occupation: substitute Magazine features editor: [employer]  Social Needs  . Financial resource strain: Not on file  . Food insecurity:    Worry: Not on file    Inability: Not on file  . Transportation needs:    Medical: Not on file    Non-medical: Not on file  Tobacco Use  . Smoking status: Never Smoker  . Smokeless tobacco: Never Used  Substance and Sexual Activity  . Alcohol use: No  . Drug use: No  . Sexual activity: Not Currently  Lifestyle  . Physical activity:    Days per week: Not on file    Minutes per session: Not on file  . Stress: Not on file  Relationships  . Social connections:    Talks on phone: Not on file    Gets together: Not on file    Attends religious service: Not on file    Active member of club or organization: Not on file    Attends meetings of clubs or organizations: Not on file    Relationship status: Not on file  . Intimate partner violence:    Fear of current or ex partner: Not on file    Emotionally abused: Not on file    Physically abused: Not on file    Forced sexual activity: Not on file  Other Topics Concern  . Not on file  Social History Narrative   Single, lives with son  and grandson in a one level home, has 7 steps to enter. Rare caffeine. Does not exercise.    REVIEW OF SYSTEMS: Constitutional: No fevers, chills, or sweats, no generalized fatigue, change in appetite Eyes: No visual changes, double vision, eye pain Ear, nose and throat: No hearing loss, ear pain, nasal congestion, sore throat Cardiovascular: No chest pain, palpitations Respiratory:  No shortness of breath at rest or with exertion, wheezes GastrointestinaI: No nausea, vomiting, diarrhea, abdominal pain, fecal incontinence Genitourinary:  No dysuria, urinary retention or frequency Musculoskeletal:  No neck pain, back pain Integumentary: No rash, pruritus, skin lesions Neurological: as above Psychiatric: No depression, insomnia, anxiety Endocrine: No palpitations, fatigue, diaphoresis, mood swings, change in appetite, change in weight, increased thirst Hematologic/Lymphatic:  No purpura, petechiae. Allergic/Immunologic: no itchy/runny eyes, nasal  congestion, recent allergic reactions, rashes  PHYSICAL EXAM: Vitals:   04/17/17 0914  BP: 110/80  Pulse: 83  SpO2: 96%   General: No acute distress.  Patient appears well-groomed.   Head:  Normocephalic/atraumatic Eyes:  Fundi examined but not visualized Neck: supple, no paraspinal tenderness, full range of motion Heart:  Regular rate and rhythm Lungs:  Clear to auscultation bilaterally Back: No paraspinal tenderness Neurological Exam: alert and oriented to person, place, and time. Attention span and concentration intact, recent and remote memory intact, fund of knowledge intact.  Speech fluent and not dysarthric, language intact.  CN II-XII intact. Bulk and tone normal, muscle strength 5/5 throughout.  Sensation to light touch  intact.  Deep tendon reflexes 2+ throughout.  Toes downgoing.  Finger to nose testing intact.  Gait normal, Romberg negative.   IMPRESSION: 1.  Chiari I Malformation.  No definite symptoms to suggest she is  symptomatic at this point.  However the Chiari is significant.  She will follow up with neurosurgery next month. 2.  Headaches, uncertain if related to Chiari. 3.  PCOM aneurysm  PLAN: 1.  To address headaches, start topiramate ER 50mg  at bedtime. 2.  Limit use of Tylenol to no more than 2 days out of week to prevent rebound headache. 3.  Headache diary 4.  Repeat MRA of head in 3 months and follow up afterwards  Shon Millet, DO  CC: Dr. Concepcion Elk

## 2017-04-17 NOTE — Patient Instructions (Addendum)
1.  Repeat MRA of head in 3 months 2.  Start topiramate ER 50mg  at bedtime to help reduce frequency of headaches.  Side effects may include numbness and tingling.  Drink plenty of water. 3.  Limit use of Tylenol to no more than 2 days out of week 4.  Follow up with neurosurgery 5.  Follow up in 3 months with me after repeat MRI   Chiari Malformation Chiari malformation (CM) is a type of brain abnormality that involves the parts of your brain that are important for balance (cerebellum) and basic body functions (brain stem). Normally, the cerebellum is located in a space at the back part of the skull, just above the opening for the spinal cord (foramen magnum). With CM, part of the cerebellum is located below the foramen magnum instead. This can cause neck pain, balance problems, and other symptoms. There are five types of CM:  Type I. ? This is the most common type. It can cause cerebrospinal fluid (CSF) to not flow between your brain and spinal cord as it normally should. ? This type may not cause symptoms, and it often goes unnoticed.  Type II. ? This type is present at birth (congenital), and it usually involves a condition in which the spine does not form properly (spina bifida). ? Type II also involves having a larger portion of brain structures move down and push through (herniate) the foramen magnum into the spinal canal.  Type III. ? This type is more severe than Types I and II, and it is the least common type. ? Type III often occurs with a type of congenital disability in which an opening in the skull causes the cerebellum and brain stem and their coverings to bulge out in a sac (encephalocele).  Type IV. ? This is also a severe form of CM. ? Part of the cerebellum may be missing, and parts of the spine and skull may be visible.  Type 0. ? In this type, the cerebellum does not herniate into or below the foramen magnum. However, abnormal flow of CSF between the brain and spinal  cord creates a collection of fluid inside the spinal cord (syrinx). This may cause symptoms.  What are the causes? CM is most commonly congenital. CM can occur when the skull forms incorrectly and provides less space for the cerebellum than normal. In rare cases, CM may also develop later in life (acquired CM or secondary CM). These cases may be caused by:  Injury.  Infection.  Abnormal structure or pressure develops in the brain, and this pushes the cerebellum down into the foramen magnum. What increases the risk? Congenital CM is more likely to occur in:  Females.  People who have a family history of CM.  What are the signs or symptoms? CM may not cause any symptoms. Symptoms may also vary depending on the type and severity of CM. Symptoms may also come and go. The most common symptom is a severe headache in the back of the head. Headache pain may come and go. It may also spread to your neck and shoulders. The pain may be worse when you cough, sneeze, or strain. Other symptoms include:  Difficulty balancing.  Loss of coordination.  Trouble swallowing or speaking.  Muscle weakness.  Feeling dizzy.  Ringing in the ears.  Fainting.  Trouble sleeping.  Fatigue.  Tingling or burning sensations in the fingers or toes.  Hearing problems.  Vision problems.  Vomiting.  Depression.  Seizures. These are only  present in more severe types of CM.  How is this diagnosed? This condition may be diagnosed with a medical history and physical exam. This may include tests to check your balance and your memory. You may also have other tests, including:  X-ray.  CT scan.  MRI.  How is this treated? Treatment for this condition depends on your symptoms and the type of CM that you have. If you have headaches or neck pain, you may be treated with pain medicine or massage therapy. If you have symptoms of CM that are severe or are getting worse, your health care provider may  recommend surgery to control your symptoms and prevent the malformation from getting worse. If you do not have symptoms, you may not need treatment. Follow these instructions at home:  Take medicines only as directed by your health care provider.  Avoid activities that involve straining and heavy lifting.  Consider joining a CM support group.  Keep all follow-up visits as directed by your health care provider. This is important. Contact a health care provider if:  You have new symptoms.  Your symptoms get worse. Get help right away if:  You have seizures that are new or different than before. This information is not intended to replace advice given to you by your health care provider. Make sure you discuss any questions you have with your health care provider. Document Released: 12/16/2001 Document Revised: 06/03/2015 Document Reviewed: 09/03/2013 Elsevier Interactive Patient Education  [employer].

## 2017-04-27 MED FILL — LOSARTAN-HCTZ 50-12.5 MG TA: 50-12.5 | 30 days supply | Qty: 30 | Fill #2

## 2017-04-27 MED FILL — TOUJEO SOLOSTAR 300 UNITS/M: 300 | 30 days supply | Qty: 3 | Fill #2

## 2017-05-04 ENCOUNTER — Encounter: Payer: Self-pay | Admitting: Neurology

## 2017-05-14 MED FILL — SYMBICORT 160-4.5 MCG INH: 160-4.5 | 30 days supply | Qty: 10 | Fill #2

## 2017-05-14 MED FILL — GLIMEPIRIDE 4 MG TABLET: 4 | 30 days supply | Qty: 30 | Fill #2

## 2017-05-14 MED FILL — PRAVASTATIN SODIUM 20 MG TA: 20 | 30 days supply | Qty: 30 | Fill #2

## 2017-05-14 MED FILL — METHOCARBAMOL 500 MG TABS: 500 | 30 days supply | Qty: 30 | Fill #0

## 2017-05-14 MED FILL — MICROLET LANCETS: 66 days supply | Qty: 200 | Fill #1

## 2017-05-25 MED FILL — TOUJEO SOLOSTAR 300 UNITS/M: 300 | 75 days supply | Qty: 8 | Fill #0

## 2017-05-25 MED FILL — CONTOUR NEXT STRIPS: 83 days supply | Qty: 250 | Fill #1

## 2017-06-05 MED FILL — LOSARTAN-HCTZ 50-12.5 MG TA: 50-12.5 | 30 days supply | Qty: 30 | Fill #3

## 2017-06-06 MED FILL — GLIMEPIRIDE 4 MG TABLET: 4 | 30 days supply | Qty: 30 | Fill #3

## 2017-06-06 MED FILL — SYMBICORT 160-4.5 MCG INH: 160-4.5 | 30 days supply | Qty: 10 | Fill #3

## 2017-06-06 MED FILL — PRAVASTATIN SODIUM 20 MG TA: 20 | 30 days supply | Qty: 30 | Fill #3

## 2017-06-06 MED FILL — METHOCARBAMOL 500 MG TABLET: 500 | 30 days supply | Qty: 30 | Fill #1

## 2017-07-06 MED FILL — PRAVASTATIN SODIUM 20 MG TA: 20 | 30 days supply | Qty: 30 | Fill #4

## 2017-07-06 MED FILL — METHOCARBAMOL 500 MG TABLET: 500 | 30 days supply | Qty: 30 | Fill #2

## 2017-07-06 MED FILL — SYMBICORT 160-4.5 MCG INH: 160-4.5 | 30 days supply | Qty: 10 | Fill #4

## 2017-07-06 MED FILL — GLIMEPIRIDE 4 MG TABLET: 4 | 30 days supply | Qty: 30 | Fill #4

## 2017-07-06 MED FILL — LOSARTAN-HCTZ 50-12.5 MG TA: 50-12.5 | 30 days supply | Qty: 30 | Fill #4

## 2017-07-20 ENCOUNTER — Ambulatory Visit
Admission: RE | Admit: 2017-07-20 | Discharge: 2017-07-20 | Disposition: A | Discharge status: Home / Self Care | Payer: BLUE CROSS/BLUE SHIELD | Source: Ambulatory Visit | Attending: Neurology | Admitting: Neurology

## 2017-07-20 ENCOUNTER — Telehealth: Payer: Self-pay | Admitting: Neurology

## 2017-07-20 ENCOUNTER — Other Ambulatory Visit: Payer: BLUE CROSS/BLUE SHIELD

## 2017-07-20 DIAGNOSIS — I671 Cerebral aneurysm, nonruptured: Secondary | ICD-10-CM

## 2017-07-20 MED ORDER — DIAZEPAM 5 MG PO TABS: 5.0000 mg | ORAL_TABLET | Freq: Once | ORAL | 0 refills | Status: AC

## 2017-07-20 MED FILL — diazePAM 5 MG TABS: 5 | 1 days supply | Qty: 1 | Fill #0

## 2017-07-20 NOTE — Telephone Encounter (Signed)
Patient was unable to have her MRI today but she was unable to due to not having any medication. Can you  Reschedule the MRI an [facility]. Please Call. Thanks

## 2017-07-20 NOTE — Telephone Encounter (Signed)
Called and advised Pt Rx had been called in, and she will need to call [facility] to r/s test. Advised her she must have a driver.

## 2017-07-29 ENCOUNTER — Ambulatory Visit
Admission: RE | Admit: 2017-07-29 | Discharge: 2017-07-29 | Disposition: A | Discharge status: Home / Self Care | Payer: BLUE CROSS/BLUE SHIELD | Source: Ambulatory Visit | Attending: Neurology | Admitting: Neurology

## 2017-07-31 ENCOUNTER — Encounter: Payer: Self-pay | Admitting: Neurology

## 2017-07-31 ENCOUNTER — Ambulatory Visit (INDEPENDENT_AMBULATORY_CARE_PROVIDER_SITE_OTHER): Payer: BLUE CROSS/BLUE SHIELD | Admitting: Neurology

## 2017-07-31 ENCOUNTER — Other Ambulatory Visit: Payer: Self-pay

## 2017-07-31 VITALS — BP 102/82 | HR 82 | Ht 64.0 in | Wt 202.0 lb

## 2017-07-31 DIAGNOSIS — R519 Headache, unspecified: Secondary | ICD-10-CM

## 2017-07-31 DIAGNOSIS — G935 Compression of brain: Secondary | ICD-10-CM | POA: Diagnosis not present

## 2017-07-31 DIAGNOSIS — R51 Headache: Secondary | ICD-10-CM | POA: Diagnosis not present

## 2017-07-31 MED FILL — TOUJEO SOLOSTAR 300 UNITS/M: 300 | 75 days supply | Qty: 8 | Fill #1

## 2017-07-31 MED FILL — MICROLET LANCETS MISC: 66 days supply | Qty: 200 | Fill #2

## 2017-07-31 MED FILL — GLIMEPIRIDE 4 MG TABLET: 4 | 30 days supply | Qty: 30 | Fill #5

## 2017-07-31 MED FILL — PRAVASTATIN SODIUM 20 MG TA: 20 | 30 days supply | Qty: 30 | Fill #5

## 2017-07-31 MED FILL — LOSARTAN-HCTZ 50-12.5 MG TA: 50-12.5 | 30 days supply | Qty: 30 | Fill #5

## 2017-07-31 MED FILL — SYMBICORT 160-4.5 MCG INH: 160-4.5 | 30 days supply | Qty: 10 | Fill #5

## 2017-07-31 NOTE — Progress Notes (Signed)
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     UPDATE: She never started the topiramate because it was not approved.  However, headaches have improved again.  Intensity:  Moderate to severe Duration:  2 minutes  Frequency:  1 to 2 days a month Current NSAIDS:  no Current analgesics:  Tylenol Current triptans:  no Current anti-emetic:  Zofran Current muscle relaxants:  no Current anti-anxiolytic:  no Current sleep aide:  no Current Antihypertensive medications:  Hyzaar Current Antidepressant medications:  no Current Anticonvulsant medications:  no Current Vitamins/Herbal/Supplements:  no Current Antihistamines/Decongestants:  no Other therapy:  no  Repeat imaging, MRA of brain, was performed on 07/29/17 to recheck aneurysm and demonstrated findings most 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  She denies double vision, dysphagia, facial numbness, numbness of the extremities and abnormal gait.   HISTORY: She has had headaches off and on since around age 75.  They are located bi-frontal/on top of her head, throbbing, moderate to severe intensity.  They are not associated with nausea, vomiting, photophobia, phonophobia, osmophobia or visual disturbance.  Denies neck pain.  They typically last several days and occur every few months (around every 4 months).  She presented to the ED on 09/15/16 with on of her habitual headaches.  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  CTA of neck showed no hemodynamically  significant stenosis but CTA of head demonstrated 2 mm left PCOM aneurysm.  She was sent home with neurology outpatient follow up.  She was admitted to the hospital a couple of days later for atypical chest pain.  Cardiac workup was negative.   Headaches had resolved soon after her hospitalization.  However, they returned this past Sunday and have persisted.  Intensity again varies.  There are no triggers.  Coughing may exacerbate it.  Rubbing her head helps.  She takes either Tylenol or ibuprofen with modest relief but headache will return.  She denies double vision, numbness, facial weakness, unilateral numbness or weakness or new/worsening headache.  She reports occasional dizziness at times and says she sometimes falls on occasion.  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  She was referred to neurosurgery.  She is to follow up next month to make a decision about surgery.  Due to continued headache, she returned to the ED on 03/03/17.  CT of head demonstrated no acute intracranial abnormality such as bleed.  PAST MEDICAL HISTORY: Past Medical History:  Diagnosis Date  . Asthma   . Diabetes mellitus without complication (HCC)   . Hypercholesterolemia   . Hypertension   . Obesity     MEDICATIONS: Current Outpatient Medications on File Prior to Visit  Medication Sig Dispense Refill  . acetaminophen (TYLENOL) 500 MG tablet Take 1,000 mg by mouth every 6 (six) hours as needed for headache (pain).    [name]  albuterol (PROAIR HFA) 108 (90 Base) MCG/ACT inhaler Inhale 2 puffs into the lungs every 6 (six) hours as needed for wheezing or shortness of breath.    [name] glimepiride (AMARYL) 4 MG tablet Take 4 mg by mouth daily before breakfast.    . ibuprofen  (ADVIL,MOTRIN) 200 MG tablet Take 400 mg by mouth every 6 (six) hours as needed for headache (pain).     . Insulin Glargine (TOUJEO SOLOSTAR) 300 UNIT/ML SOPN Inject 30 Units into the skin daily.    [name] losartan-hydrochlorothiazide (HYZAAR) 50-12.5 MG tablet Take 1 tablet by mouth daily.   0  . ondansetron (ZOFRAN) 4 MG tablet Take 1 tablet (4 mg total) by mouth every 6 (six) hours as needed for nausea or vomiting. 12 tablet 0  . pravastatin (PRAVACHOL) 20 MG tablet Take 20 mg by mouth daily.    . SYMBICORT 160-4.5 MCG/ACT inhaler Inhale 2 puffs into the lungs as needed (Shortness of breath).   4  . Topiramate ER (TROKENDI XR) 50 MG CP24 Take 1 capsule by mouth at bedtime. (Patient not taking: Reported on 07/31/2017) 30 capsule 2   No current facility-administered medications on file prior to visit.     ALLERGIES: No Known Allergies  FAMILY HISTORY: Family History  Problem Relation Age of Onset  . Hypertension Mother   . Diabetes Mother   . Asthma Mother   . High blood pressure Father   . High blood pressure Sister   . Diabetes Sister   . Asthma Sister     SOCIAL HISTORY: Social History   Socioeconomic History  . Marital status: Single    Spouse name: Not on file  . Number of children: 4  . Years of education: Not on file  . Highest education level: 12th grade  Occupational History  . Occupation: substitute Magazine features editor: [employer]  Social Needs  . Financial resource strain: Not on file  . Food insecurity:    Worry: Not on file    Inability: Not on file  . Transportation needs:    Medical: Not on file    Non-medical: Not on file  Tobacco Use  . Smoking status: Never Smoker  . Smokeless tobacco: Never Used  Substance and Sexual Activity  . Alcohol use: No  . Drug use: No  . Sexual activity: Not Currently  Lifestyle  . Physical activity:    Days per week: Not on file    Minutes per session: Not on file  . Stress: Not on file  Relationships  .  Social connections:    Talks on phone: Not on file    Gets together: Not on file    Attends religious service: Not on file    Active member of club or organization: Not on file    Attends meetings of clubs or organizations: Not on file    Relationship status: Not on file  . Intimate partner violence:    Fear of current or ex partner: Not on file    Emotionally abused: Not on file    Physically abused: Not on file    Forced sexual activity: Not on file  Other Topics Concern  . Not on file  Social History Narrative   Single, lives with son and grandson in a one level home, has 7 steps to enter. Rare caffeine. Does not exercise.    REVIEW OF SYSTEMS: Constitutional: No fevers, chills, or sweats, no generalized fatigue, change in appetite Eyes: No visual changes, double  vision, eye pain Ear, nose and throat: No hearing loss, ear pain, nasal congestion, sore throat Cardiovascular: No chest pain, palpitations Respiratory:  No shortness of breath at rest or with exertion, wheezes GastrointestinaI: No nausea, vomiting, diarrhea, abdominal pain, fecal incontinence Genitourinary:  No dysuria, urinary retention or frequency Musculoskeletal:  No neck pain, back pain Integumentary: No rash, pruritus, skin lesions Neurological: as above Psychiatric: No depression, insomnia, anxiety Endocrine: No palpitations, fatigue, diaphoresis, mood swings, change in appetite, change in weight, increased thirst Hematologic/Lymphatic:  No purpura, petechiae. Allergic/Immunologic: no itchy/runny eyes, nasal congestion, recent allergic reactions, rashes  PHYSICAL EXAM: Vitals:   07/31/17 0858  BP: 102/82  Pulse: 82  SpO2: 94%   General: No acute distress.  Patient appears well-groomed.  Head:  Normocephalic/atraumatic Eyes:  Fundi examined but not visualized Neck: supple, no paraspinal tenderness, full range of motion Heart:  Regular rate and rhythm Lungs:  Clear to auscultation bilaterally Back: No  paraspinal tenderness Neurological Exam: alert and oriented to person, place, and time. Attention span and concentration intact, recent and remote memory intact, fund of knowledge intact.  Speech fluent and not dysarthric, language intact.  CN II-XII intact. Bulk and tone normal, muscle strength 5/5 throughout.  Sensation to light touch  intact.  Deep tendon reflexes 2+ throughout.  Finger to nose testing intact.  Gait normal, Romberg negative.  IMPRESSION: 1.  Headaches, resolved.  Unlikely related to Chiari 1 malformation 2.  Chiari I Malformation.  It does not appear to be symptomatic.  She is uncertain of neurosurgery's opinion. 3.  Repeat imaging demonstrates that finding consistent with infundibulum rather than pcomm aneurysm.  PLAN: Follow up as needed.  If headaches return, we can start topiramate (it should be covered). Limit pain relievers to no more than 2 days out of week to prevent rebound headache.  25 minutes spent face to face with patient, over 50% spent discussing management, Chiari and MRA findings.  Shon MilletAdam Rifka Ramey, DO  CC:  Dr. Concepcion ElkAvbuere

## 2017-07-31 NOTE — Patient Instructions (Signed)
I am glad your headaches resolved.  If they should return, contact me and we can start that medication (topiramate) and then make a follow up appointment  The imaging reveals that you in fact don't have an aneurysm, which is good.

## 2017-08-24 MED FILL — UNIFINE PENTIPS 8MM 31G: 31G X 8 MM | 90 days supply | Qty: 100 | Fill #0

## 2017-08-28 MED FILL — PRAVASTATIN SODIUM 20 MG TA: 20 | 30 days supply | Qty: 30 | Fill #0

## 2017-08-28 MED FILL — GLIMEPIRIDE 4 MG TABLET: 4 | 30 days supply | Qty: 30 | Fill #0

## 2017-08-28 MED FILL — METHOCARBAMOL 500 MG TABLET: 500 | 30 days supply | Qty: 30 | Fill #0

## 2017-08-28 MED FILL — LOSARTAN-HCTZ 50-12.5 MG TA: 50-12.5 | 30 days supply | Qty: 30 | Fill #0

## 2017-10-02 MED FILL — LOSARTAN-HCTZ 50-12.5 MG TA: 50-12.5 | 30 days supply | Qty: 30 | Fill #1

## 2017-10-02 MED FILL — GLIMEPIRIDE 4 MG TABLET: 4 | 30 days supply | Qty: 30 | Fill #1

## 2017-10-02 MED FILL — METHOCARBAMOL 500 MG TABLET: 500 | 30 days supply | Qty: 30 | Fill #1

## 2017-10-02 MED FILL — PRAVASTATIN SODIUM 20 MG TA: 20 | 30 days supply | Qty: 30 | Fill #1

## 2017-10-02 MED FILL — PROAIR HFA 90 MCG INHALER: 108 (90 BAS | 25 days supply | Qty: 9 | Fill #1

## 2017-10-02 MED FILL — SYMBICORT 160-4.5 MCG INH: 160-4.5 | 30 days supply | Qty: 10 | Fill #0

## 2017-10-16 MED FILL — TOUJEO SOLOSTAR 300 UNITS/M: 300 | 75 days supply | Qty: 8 | Fill #2

## 2017-10-16 MED FILL — SYMBICORT 160-4.5 MCG INH: 160-4.5 | 30 days supply | Qty: 10 | Fill #0

## 2017-10-16 MED FILL — MICROLET LANCETS MISC: 66 days supply | Qty: 200 | Fill #3

## 2017-10-16 MED FILL — CONTOUR NEXT STRIPS: 83 days supply | Qty: 250 | Fill #2

## 2017-10-23 ENCOUNTER — Ambulatory Visit: Payer: Self-pay | Admitting: Physician Assistant

## 2017-11-08 MED FILL — PRAVASTATIN SODIUM 20 MG TA: 20 | 30 days supply | Qty: 30 | Fill #2

## 2017-11-08 MED FILL — GLIMEPIRIDE 4 MG TABLET: 4 | 30 days supply | Qty: 30 | Fill #2

## 2017-11-08 MED FILL — LOSARTAN-HCTZ 50-12.5 MG TA: 50-12.5 | 30 days supply | Qty: 30 | Fill #2

## 2017-11-08 MED FILL — METHOCARBAMOL 500 MG TABLET: 500 | 30 days supply | Qty: 30 | Fill #2

## 2017-11-08 MED FILL — PROAIR HFA 90 MCG INHALER: 108 (90 BAS | 25 days supply | Qty: 9 | Fill #2

## 2017-12-12 MED FILL — LOSARTAN-HCTZ 50-12.5 MG TA: 50-12.5 | 27 days supply | Qty: 27 | Fill #3

## 2017-12-12 MED FILL — UNIFINE PENTIPS 8MM 31G: 31G X 8 MM | 90 days supply | Qty: 100 | Fill #1

## 2017-12-12 MED FILL — METHOCARBAMOL 500 MG TABLET: 500 | 30 days supply | Qty: 30 | Fill #3

## 2017-12-12 MED FILL — PRAVASTATIN SODIUM 20 MG TA: 20 | 30 days supply | Qty: 30 | Fill #3

## 2017-12-12 MED FILL — PROAIR HFA 90 MCG INHALER: 108 (90 BAS | 25 days supply | Qty: 9 | Fill #3

## 2017-12-12 MED FILL — GLIMEPIRIDE 4 MG TABLET: 4 | 30 days supply | Qty: 30 | Fill #3

## 2017-12-12 MED FILL — SYMBICORT 160-4.5 MCG INH: 160-4.5 | 30 days supply | Qty: 10 | Fill #1

## 2018-01-04 MED FILL — GLIMEPIRIDE 4 MG TABLET: 4 | 30 days supply | Qty: 30 | Fill #4

## 2018-01-04 MED FILL — PROAIR HFA 90 MCG INHALER: 108 (90 BAS | 25 days supply | Qty: 9 | Fill #4

## 2018-01-04 MED FILL — METHOCARBAMOL 500 MG TABLET: 500 | 30 days supply | Qty: 30 | Fill #4

## 2018-01-04 MED FILL — CONTOUR NEXT STRIPS: 83 days supply | Qty: 250 | Fill #3

## 2018-01-04 MED FILL — SYMBICORT 160-4.5 MCG INH: 160-4.5 | 30 days supply | Qty: 10 | Fill #2

## 2018-01-04 MED FILL — TOUJEO SOLOSTAR 300 UNITS/M: 300 | 75 days supply | Qty: 8 | Fill #3

## 2018-01-04 MED FILL — PRAVASTATIN SODIUM 20 MG TA: 20 | 30 days supply | Qty: 30 | Fill #4

## 2018-01-04 MED FILL — LOSARTAN-HCTZ 100-25 MG TAB: 100-25 | 36 days supply | Qty: 18 | Fill #0

## 2018-01-04 MED FILL — MICROLET LANCETS MISC: 66 days supply | Qty: 200 | Fill #4

## 2018-01-07 ENCOUNTER — Telehealth: Payer: Self-pay | Admitting: Neurology

## 2018-01-07 NOTE — Telephone Encounter (Signed)
Needs to talk to someone about a letter stating she has been discharged from the care of Dr Everlena CooperJaffe. She states when she saw him last he told her that she did not need to come back

## 2018-01-08 NOTE — Telephone Encounter (Signed)
Called and spoke with Pt, she actually wanted a copy of the last office note indicating she did not need to be seen again. Mailing it to Pt

## 2018-02-11 MED FILL — GLIMEPIRIDE 4 MG TABLET: 4 | 30 days supply | Qty: 30 | Fill #5

## 2018-02-11 MED FILL — PRAVASTATIN SODIUM 20 MG TA: 20 | 30 days supply | Qty: 30 | Fill #5

## 2018-02-11 MED FILL — PROAIR HFA 90 MCG INHALER: 108 (90 BAS | 25 days supply | Qty: 9 | Fill #5

## 2018-02-11 MED FILL — SYMBICORT 160-4.5 MCG INH: 160-4.5 | 30 days supply | Qty: 10 | Fill #3

## 2018-02-11 MED FILL — UNIFINE PENTIPS 8MM 31G: 31G X 8 MM | 90 days supply | Qty: 100 | Fill #2

## 2018-03-07 MED FILL — LOSARTAN POTASSIUM 50 MG TA: 50 | 30 days supply | Qty: 30 | Fill #0

## 2018-03-07 MED FILL — HYDROCHLOROTHIAZIDE 12.5 MG: 12.5 | 30 days supply | Qty: 30 | Fill #0

## 2018-03-08 MED FILL — GLIMEPIRIDE 4 MG TABLET: 4 | 30 days supply | Qty: 30 | Fill #0

## 2018-03-08 MED FILL — PRAVASTATIN SODIUM 20 MG TA: 20 | 30 days supply | Qty: 30 | Fill #0

## 2018-03-22 MED FILL — TOUJEO SOLOSTAR 300 UNITS/M: 300 | 30 days supply | Qty: 3 | Fill #0

## 2018-04-03 MED FILL — PRAVASTATIN SODIUM 20 MG TA: 20 | 30 days supply | Qty: 30 | Fill #1

## 2018-04-03 MED FILL — HYDROCHLOROTHIAZIDE 12.5 MG: 12.5 | 30 days supply | Qty: 30 | Fill #1

## 2018-04-03 MED FILL — GLIMEPIRIDE 4 MG TABLET: 4 | 30 days supply | Qty: 30 | Fill #1

## 2018-04-03 MED FILL — LOSARTAN POTASSIUM 50 MG TA: 50 | 30 days supply | Qty: 30 | Fill #1

## 2018-04-06 ENCOUNTER — Emergency Department (HOSPITAL_COMMUNITY): Payer: BLUE CROSS/BLUE SHIELD

## 2018-04-06 ENCOUNTER — Emergency Department (HOSPITAL_COMMUNITY)
Admission: EM | Admit: 2018-04-06 | Discharge: 2018-04-06 | Disposition: A | Discharge status: Home / Self Care | Payer: BLUE CROSS/BLUE SHIELD | Attending: Emergency Medicine | Admitting: Emergency Medicine

## 2018-04-06 ENCOUNTER — Encounter (HOSPITAL_COMMUNITY): Payer: Self-pay

## 2018-04-06 ENCOUNTER — Other Ambulatory Visit: Payer: Self-pay

## 2018-04-06 DIAGNOSIS — R05 Cough: Secondary | ICD-10-CM | POA: Diagnosis not present

## 2018-04-06 DIAGNOSIS — J45909 Unspecified asthma, uncomplicated: Secondary | ICD-10-CM | POA: Diagnosis not present

## 2018-04-06 DIAGNOSIS — Z79899 Other long term (current) drug therapy: Secondary | ICD-10-CM | POA: Insufficient documentation

## 2018-04-06 DIAGNOSIS — E119 Type 2 diabetes mellitus without complications: Secondary | ICD-10-CM | POA: Insufficient documentation

## 2018-04-06 DIAGNOSIS — Z794 Long term (current) use of insulin: Secondary | ICD-10-CM | POA: Insufficient documentation

## 2018-04-06 DIAGNOSIS — R059 Cough, unspecified: Secondary | ICD-10-CM

## 2018-04-06 LAB — RAPID URINE DRUG SCREEN, HOSP PERFORMED
AMPHETAMINES: NOT DETECTED
BARBITURATES: NOT DETECTED
BENZODIAZEPINES: NOT DETECTED
Cocaine: NOT DETECTED
Opiates: NOT DETECTED
TETRAHYDROCANNABINOL: NOT DETECTED

## 2018-04-06 LAB — CBC WITH DIFFERENTIAL/PLATELET
ABS IMMATURE GRANULOCYTES: 0.03 10*3/uL (ref 0.00–0.07)
Basophils Absolute: 0 10*3/uL (ref 0.0–0.1)
Basophils Relative: 0 %
Eosinophils Absolute: 0.1 10*3/uL (ref 0.0–0.5)
Eosinophils Relative: 1 %
HCT: 45 % (ref 36.0–46.0)
Hemoglobin: 14 g/dL (ref 12.0–15.0)
IMMATURE GRANULOCYTES: 0 %
Lymphocytes Relative: 30 %
Lymphs Abs: 2.7 10*3/uL (ref 0.7–4.0)
MCH: 26.3 pg (ref 26.0–34.0)
MCHC: 31.1 g/dL (ref 30.0–36.0)
MCV: 84.6 fL (ref 80.0–100.0)
Monocytes Absolute: 0.9 10*3/uL (ref 0.1–1.0)
Monocytes Relative: 10 %
NEUTROS ABS: 5.4 10*3/uL (ref 1.7–7.7)
NEUTROS PCT: 59 %
Platelets: 336 10*3/uL (ref 150–400)
RBC: 5.32 MIL/uL — ABNORMAL HIGH (ref 3.87–5.11)
RDW: 13.1 % (ref 11.5–15.5)
WBC: 9.1 10*3/uL (ref 4.0–10.5)
nRBC: 0 % (ref 0.0–0.2)

## 2018-04-06 LAB — BASIC METABOLIC PANEL
ANION GAP: 11 (ref 5–15)
BUN: 21 mg/dL — ABNORMAL HIGH (ref 6–20)
CHLORIDE: 98 mmol/L (ref 98–111)
CO2: 26 mmol/L (ref 22–32)
Calcium: 9.6 mg/dL (ref 8.9–10.3)
Creatinine, Ser: 1.2 mg/dL — ABNORMAL HIGH (ref 0.44–1.00)
GFR calc Af Amer: 60 mL/min — ABNORMAL LOW (ref >60)
GFR, EST NON AFRICAN AMERICAN: 52 mL/min — AB (ref >60)
GLUCOSE: 310 mg/dL — AB (ref 70–99)
POTASSIUM: 3.5 mmol/L (ref 3.5–5.1)
Sodium: 135 mmol/L (ref 135–145)

## 2018-04-06 MED ORDER — SODIUM CHLORIDE 0.9 % IV BOLUS
1000.0000 mL | Freq: Once | INTRAVENOUS | Status: AC
  Administered 2018-04-06: 1000 mL via INTRAVENOUS

## 2018-04-06 MED ORDER — SODIUM CHLORIDE (PF) 0.9 % IJ SOLN
INTRAMUSCULAR | Status: AC
  Filled 2018-04-06: qty 50

## 2018-04-06 MED ORDER — IOHEXOL 350 MG/ML SOLN
100.0000 mL | Freq: Once | INTRAVENOUS | Status: AC | PRN
  Administered 2018-04-06: 100 mL via INTRAVENOUS

## 2018-04-06 MED ORDER — BENZONATATE 100 MG PO CAPS: 100.0000 mg | ORAL_CAPSULE | Freq: Three times a day (TID) | ORAL | 0 refills | Status: AC

## 2018-04-06 NOTE — Discharge Instructions (Signed)
Due to current concern for COVID pandemic, you should be isolated for at least 7 days since the onset of your symptoms AND >72 hours after symptoms resolution (absence of fever without the use of fever reducing medication and improvement in respiratory symptoms), whichever is longer  Please follow up with your primary care provider within 5-7 days for re-evaluation of your symptoms. If you do not have a primary care provider, information for a healthcare clinic has been provided for you to make arrangements for follow up care. Please return to the emergency department for any new or worsening symptoms.

## 2018-04-06 NOTE — ED Provider Notes (Signed)
[facility]-EMERGENCY DEPT Provider Note   CSN: [id number] Arrival date & time: 04/06/18  1713    History   Chief Complaint Chief Complaint  Patient presents with   Cough    HPI Leslie Wood is a 53 y.o. female.     HPI   Pt is a 53 y/o female with a h/o asthma, T2DM, HLD, HTN, obesity, who presents to the ED today for evaluation of a dry cough. Pt states cough has been present for the last 2 months. She denies cp or sob. States he had some LLE swelling about 1 month ago but this has resolved. Denies other uri sxs including no fevers, chills, rhinorrhea, congestion, sore throat.   Denies hemoptysis, recent surgery/trauma, recent long travel, hormone use, personal hx of cancer, or hx of DVT/PE.   Past Medical History:  Diagnosis Date   Asthma    Diabetes mellitus without complication (HCC)    Hypercholesterolemia    Hypertension    Obesity     Patient Active Problem List   Diagnosis Date Noted   Substernal chest pain 09/17/2016   Diabetes mellitus without complication (HCC) 09/17/2016   Asthma 09/17/2016   Hypercholesterolemia 09/17/2016   Benign essential HTN 09/17/2016    Past Surgical History:  Procedure Laterality Date   CESAREAN SECTION     IR REMOVAL OF CALCULI/DEBRIS BILIARY DUCT/GB       OB History   No obstetric history on file.      Home Medications    Prior to Admission medications   Medication Sig Start Date End Date Taking? Authorizing Provider  acetaminophen (TYLENOL) 500 MG tablet Take 1,000 mg by mouth every 6 (six) hours as needed for headache (pain).    [provider]  albuterol (PROAIR HFA) 108 (90 Base) MCG/ACT inhaler Inhale 2 puffs into the lungs every 6 (six) hours as needed for wheezing or shortness of breath.    [provider]  benzonatate (TESSALON) 100 MG capsule Take 1 capsule (100 mg total) by mouth every 8 (eight) hours for 5 days. 04/06/18 04/11/18  Esther Bradstreet S, PA-C    diazepam (VALIUM) 5 MG tablet  07/20/17   [provider]  glimepiride (AMARYL) 4 MG tablet Take 4 mg by mouth daily before breakfast.    [provider]  ibuprofen (ADVIL,MOTRIN) 200 MG tablet Take 400 mg by mouth every 6 (six) hours as needed for headache (pain).     [provider]  Insulin Glargine (TOUJEO SOLOSTAR) 300 UNIT/ML SOPN Inject 30 Units into the skin daily.    [provider]  losartan-hydrochlorothiazide (HYZAAR) 50-12.5 MG tablet Take 1 tablet by mouth daily.  09/04/16   [provider]  methocarbamol (ROBAXIN) 500 MG tablet  07/06/17   [provider]  ondansetron (ZOFRAN) 4 MG tablet Take 1 tablet (4 mg total) by mouth every 6 (six) hours as needed for nausea or vomiting. 07/06/13   Dione Booze, MD  pravastatin (PRAVACHOL) 20 MG tablet Take 20 mg by mouth daily.    [provider]  SYMBICORT 160-4.5 MCG/ACT inhaler Inhale 2 puffs into the lungs as needed (Shortness of breath).  01/11/17   [provider]  Topiramate ER (TROKENDI XR) 50 MG CP24 Take 1 capsule by mouth at bedtime. Patient not taking: Reported on 07/31/2017 04/17/17   Drema Dallas, DO    Family History Family History  Problem Relation Age of Onset   Hypertension Mother    Diabetes Mother  Asthma Mother    High blood pressure Father    High blood pressure Sister    Diabetes Sister    Asthma Sister     Social History Social History   Tobacco Use   Smoking status: Never Smoker   Smokeless tobacco: Never Used  Substance Use Topics   Alcohol use: No   Drug use: No     Allergies   Patient has no known allergies.   Review of Systems Review of Systems  Constitutional: Negative for chills and fever.  HENT: Negative for congestion, ear pain, rhinorrhea and sore throat.   Eyes: Negative for pain and visual disturbance.  Respiratory: Positive for cough. Negative for shortness of breath and wheezing.   Cardiovascular:  Positive for leg swelling (resolved). Negative for chest pain.  Gastrointestinal: Negative for abdominal pain, constipation, diarrhea, nausea and vomiting.  Genitourinary: Negative for dysuria and hematuria.  Musculoskeletal: Negative for back pain.  Skin: Negative for rash.  Neurological: Negative for headaches.  All other systems reviewed and are negative.    Physical Exam Updated Vital Signs BP 136/84 (BP Location: Left Arm)    Pulse 90    Temp 98.1 F (36.7 C) (Oral)    Resp 18    LMP 05/22/2012 Comment: negative pregnancy test result 09-15-16   SpO2 97%   Physical Exam Vitals signs and nursing note reviewed.  Constitutional:      General: She is not in acute distress.    Appearance: She is well-developed.  HENT:     Head: Normocephalic and atraumatic.     Nose: Nose normal.     Mouth/Throat:     Mouth: Mucous membranes are moist.  Eyes:     Extraocular Movements: Extraocular movements intact.     Conjunctiva/sclera: Conjunctivae normal.     Pupils: Pupils are equal, round, and reactive to light.  Neck:     Musculoskeletal: Neck supple.  Cardiovascular:     Rate and Rhythm: Regular rhythm. Tachycardia present.     Heart sounds: Normal heart sounds. No murmur.     Comments: Tachycardic in the 120-130s on monitor.  Pulmonary:     Effort: Pulmonary effort is normal. No respiratory distress.     Breath sounds: Normal breath sounds. No stridor. No wheezing, rhonchi or rales.     Comments: satting between 91-95% on ra on monitor Abdominal:     General: Bowel sounds are normal.     Palpations: Abdomen is soft.     Tenderness: There is no abdominal tenderness.  Musculoskeletal:        General: No tenderness.     Right lower leg: No edema.     Left lower leg: No edema.  Skin:    General: Skin is warm and dry.  Neurological:     Mental Status: She is alert.      ED Treatments / Results  Labs (all labs ordered are listed, but only abnormal results are displayed) Labs  Reviewed  CBC WITH DIFFERENTIAL/PLATELET - Abnormal; Notable for the following components:      Result Value   RBC 5.32 (*)    All other components within normal limits  BASIC METABOLIC PANEL - Abnormal; Notable for the following components:   Glucose, Bld 310 (*)    BUN 21 (*)    Creatinine, Ser 1.20 (*)    GFR calc non Af Amer 52 (*)    GFR calc Af Amer 60 (*)    All other components within normal limits  RAPID URINE DRUG SCREEN, HOSP PERFORMED    EKG EKG Interpretation  Date/Time:  Saturday April 06 2018 18:22:48 EDT Ventricular Rate:  124 PR Interval:    QRS Duration: 79 QT Interval:  317 QTC Calculation: 456 R Axis:   70 Text Interpretation:  Sinus tachycardia Probable left atrial enlargement Abnormal R-wave progression, early transition Probable left ventricular hypertrophy Confirmed by Donnetta Hutching ([id number]) on 04/06/2018 7:13:39 PM   Radiology Ct Angio Chest Pe W And/or Wo Contrast  Result Date: 04/06/2018 CLINICAL DATA:  Hypoxia, tachycardia EXAM: CT ANGIOGRAPHY CHEST WITH CONTRAST TECHNIQUE: Multidetector CT imaging of the chest was performed using the standard protocol during bolus administration of intravenous contrast. Multiplanar CT image reconstructions and MIPs were obtained to evaluate the vascular anatomy. CONTRAST:  OMNIPAQUE IOHEXOL 350 MG/ML SOLN COMPARISON:  09/18/2016 FINDINGS: Cardiovascular: Satisfactory opacification of the pulmonary arteries to the segmental level. No evidence of pulmonary embolism. Normal heart size. No pericardial effusion. Mediastinum/Nodes: No enlarged mediastinal, hilar, or axillary lymph nodes. Thyroid gland, trachea, and esophagus demonstrate no significant findings. Lungs/Pleura: Right basilar scarring or atelectasis. No pleural effusion or pneumothorax. Upper Abdomen: No acute abnormality. Musculoskeletal: No chest wall abnormality. No acute or significant osseous findings. Review of the MIP images confirms the above findings.  IMPRESSION: 1.  Negative examination for pulmonary embolism. 2. Right basilar scarring or atelectasis with elevation of the right hemidiaphragm. Electronically Signed   By: Lauralyn Primes M.D.   On: 04/06/2018 19:50    Procedures Procedures (including critical care time)  Medications Ordered in ED Medications  sodium chloride (PF) 0.9 % injection (has no administration in time range)  sodium chloride 0.9 % bolus 1,000 mL (0 mLs Intravenous Stopped 04/06/18 2120)  iohexol (OMNIPAQUE) 350 MG/ML injection 100 mL (100 mLs Intravenous Contrast Given 04/06/18 1926)     Initial Impression / Assessment and Plan / ED Course  I have reviewed the triage vital signs and the nursing notes.  Pertinent labs & imaging results that were available during my care of the patient were reviewed by me and considered in my medical decision making (see chart for details).     Final Clinical Impressions(s) / ED Diagnoses   Final diagnoses:  Cough   Pt is a 53 y/o female presents to the ED today for evaluation of a dry cough.  Pt afebrile with normal bpOn eval, pt tachycardic on monitor. sats dropped to 91% on monitor. Lungs ctab. No tachypneic. RRR. No peripheral edema.   Due to reports of recent lle swelling (resolved), tachycardia, and borderline o2 sats with cough, labs and cta ordered to r/o pe.  Cbc without leukocytosis Bmp with elevated bs, appears she has had this in the past. No elevated anion gap and bicarb normal. Doubt dka. Bun/cr slightly elevated 21, 1.2. pt does admit she thinks she is dehydrated. uds negative, denies drug use. Ordered to r/o illicit substance use as cause for her tachycardia.  Ct pe protocol negative for pulmonary embolus. Pt given 1l fluid bolus and hr improved to 90. I do question dehydration as cause for tachycardia. With regard to her cough, she has no evidence of pna. I doubt flu w/o fevers or other sxs and regardless, she is outside the window for tx. Testing deferred. She  does not meet criteria for covid testing as she does not require admission. I advised on quarantining. She voices understanding and is in agreement with plan. Return precautions discussed. All questions answered and pt stable for discharge.  Azariyah Kowalkowski was evaluated in Emergency Department on 04/06/2018 for the symptoms described in the history of present illness. She was evaluated in the context of the global COVID-19 pandemic, which necessitated consideration that the patient might be at risk for infection with the SARS-CoV-2 virus that causes COVID-19. Institutional protocols and algorithms that pertain to the evaluation of patients at risk for COVID-19 are in a state of rapid change based on information released by regulatory bodies including the CDC and federal and state organizations. These policies and algorithms were followed during the patient's care in the ED.   ED Discharge Orders         Ordered    benzonatate (TESSALON) 100 MG capsule  Every 8 hours     04/06/18 2045           Rayne Du 04/06/18 2144    Donnetta Hutching, MD 04/07/18 [phone]

## 2018-04-06 NOTE — ED Triage Notes (Signed)
Pt presents with a non-productive cough that started per pt " months ago" pt states that ever since she has been using "plug-ins" she has been having these symptoms. Pt inquired to  why she had come into today. Pt states that her symptoms have not been getting worse, but she was bringing her son in to be evaluated and thought to have herself checked out as well . Pt is able to speak in full sentences, Denies SOB, Fever, pain, or contact with sick person. Pt son brought in with c/o n/v/HA

## 2018-04-09 MED FILL — FLUTICASONE PROP 50 MCG SPR: 50 | 30 days supply | Qty: 16 | Fill #0

## 2018-04-24 MED FILL — TOUJEO SOLOSTAR 300 UNITS/M: 300 | 30 days supply | Qty: 3 | Fill #1

## 2018-05-02 MED FILL — HYDROCHLOROTHIAZIDE 12.5 MG: 12.5 | 30 days supply | Qty: 30 | Fill #2

## 2018-05-02 MED FILL — UNIFINE PENTIPS 8MM 31G: 31G X 8 MM | 90 days supply | Qty: 100 | Fill #3

## 2018-05-02 MED FILL — PRAVASTATIN SODIUM 20 MG TA: 20 | 30 days supply | Qty: 30 | Fill #2

## 2018-05-02 MED FILL — SYMBICORT 160-4.5 MCG INH: 160-4.5 | 30 days supply | Qty: 10 | Fill #4

## 2018-05-02 MED FILL — LOSARTAN POTASSIUM 50 MG TA: 50 | 30 days supply | Qty: 30 | Fill #2

## 2018-05-03 MED FILL — GLIMEPIRIDE 4 MG TABLET: 4 | 30 days supply | Qty: 30 | Fill #0

## 2018-05-17 ENCOUNTER — Other Ambulatory Visit: Payer: Self-pay | Admitting: Internal Medicine

## 2018-05-17 DIAGNOSIS — Z1231 Encounter for screening mammogram for malignant neoplasm of breast: Secondary | ICD-10-CM

## 2018-05-17 MED FILL — GLIMEPIRIDE 4 MG TABLET: 4 | 30 days supply | Qty: 30 | Fill #0

## 2018-05-20 MED FILL — TOUJEO SOLOSTAR 300 UNITS/M: 300 | 30 days supply | Qty: 3 | Fill #2

## 2018-07-24 MED FILL — CONTOUR NEXT STRIPS: 83 days supply | Qty: 250 | Fill #0

## 2018-07-24 MED FILL — MICROLET LANCETS MISC: 67 days supply | Qty: 200 | Fill #0

## 2018-07-24 MED FILL — TOUJEO SOLOSTAR 300 UNITS/M: 300 | 30 days supply | Qty: 3 | Fill #3

## 2018-08-27 MED FILL — HYDROCHLOROTHIAZIDE 12.5 MG: 12.5 | 30 days supply | Qty: 30 | Fill #0

## 2018-08-27 MED FILL — ALBUTEROL SULFATE HFA 108 (: 108 (90 BAS | 25 days supply | Qty: 9 | Fill #0

## 2018-08-27 MED FILL — LOSARTAN POTASSIUM 50 MG TA: 50 | 30 days supply | Qty: 30 | Fill #0

## 2018-08-27 MED FILL — SYMBICORT 160-4.5 MCG INH: 160-4.5 | 30 days supply | Qty: 10 | Fill #0

## 2018-08-27 MED FILL — FLUTICASONE PROP 50 MCG SPR: 50 | 30 days supply | Qty: 16 | Fill #0

## 2018-08-27 MED FILL — PRAVASTATIN SODIUM 20 MG TA: 20 | 30 days supply | Qty: 30 | Fill #0

## 2018-08-27 MED FILL — GLIMEPIRIDE 4 MG TABS: 4 | 30 days supply | Qty: 30 | Fill #0

## 2018-08-27 MED FILL — TOUJEO SOLOSTAR 300 UNITS/M: 300 | 30 days supply | Qty: 3 | Fill #0

## 2018-09-26 MED FILL — FLUTICASONE PROP 50 MCG SPR: 50 | 30 days supply | Qty: 16 | Fill #1

## 2018-09-26 MED FILL — GLIMEPIRIDE 4 MG TABS: 4 | 30 days supply | Qty: 30 | Fill #1

## 2018-09-26 MED FILL — PRAVASTATIN SODIUM 20 MG TA: 20 | 30 days supply | Qty: 30 | Fill #1

## 2018-09-26 MED FILL — LOSARTAN POTASSIUM 50 MG TA: 50 | 30 days supply | Qty: 30 | Fill #1

## 2018-09-26 MED FILL — HYDROCHLOROTHIAZIDE 12.5 MG: 12.5 | 30 days supply | Qty: 30 | Fill #1

## 2018-09-26 MED FILL — SYMBICORT 160-4.5 MCG INH: 160-4.5 | 30 days supply | Qty: 10 | Fill #1

## 2018-09-26 MED FILL — TOUJEO SOLOSTAR 300 UNITS/M: 300 | 30 days supply | Qty: 3 | Fill #1

## 2018-10-24 MED FILL — TOUJEO SOLOSTAR 300 UNITS/M: 300 | 30 days supply | Qty: 3 | Fill #2

## 2018-10-25 MED FILL — UNIFINE PENTIPS 8MM 31G: 31G X 8 MM | 90 days supply | Qty: 100 | Fill #0

## 2018-11-04 MED FILL — FLUTICASONE PROP 50 MCG SPR: 50 | 30 days supply | Qty: 16 | Fill #2

## 2018-11-04 MED FILL — HYDROCHLOROTHIAZIDE 12.5 MG: 12.5 | 30 days supply | Qty: 30 | Fill #2

## 2018-11-04 MED FILL — GLIMEPIRIDE 4 MG TABLET: 4 | 30 days supply | Qty: 30 | Fill #2

## 2018-11-04 MED FILL — LOSARTAN POTASSIUM 50 MG TA: 50 | 30 days supply | Qty: 30 | Fill #2

## 2018-11-04 MED FILL — ALBUTEROL SULFATE HFA 108 (: 108 (90 BAS | 25 days supply | Qty: 9 | Fill #1

## 2018-11-04 MED FILL — PRAVASTATIN SODIUM 20 MG TA: 20 | 30 days supply | Qty: 30 | Fill #2

## 2018-11-04 MED FILL — SYMBICORT 160-4.5 MCG INH: 160-4.5 | 30 days supply | Qty: 10 | Fill #2

## 2018-11-18 MED FILL — FARXIGA 5 MG TABLET: 5 | 30 days supply | Qty: 30 | Fill #1

## 2018-11-18 MED FILL — TOUJEO SOLOSTAR 300 UNITS/M: 300 | 30 days supply | Qty: 3 | Fill #3

## 2018-12-02 MED FILL — FLUTICASONE PROP 50 MCG SPR: 50 | 30 days supply | Qty: 16 | Fill #3

## 2018-12-02 MED FILL — SYMBICORT 160-4.5 MCG INH: 160-4.5 | 30 days supply | Qty: 10 | Fill #3

## 2018-12-02 MED FILL — HYDROCHLOROTHIAZIDE 12.5 MG: 12.5 | 30 days supply | Qty: 30 | Fill #3

## 2018-12-02 MED FILL — PRAVASTATIN SODIUM 20 MG TA: 20 | 30 days supply | Qty: 30 | Fill #3

## 2018-12-02 MED FILL — GLIMEPIRIDE 4 MG TABLET: 4 | 30 days supply | Qty: 30 | Fill #3

## 2018-12-02 MED FILL — ALBUTEROL SULFATE HFA 108 (: 108 (90 BAS | 25 days supply | Qty: 9 | Fill #2

## 2018-12-02 MED FILL — LOSARTAN POTASSIUM 50 MG TA: 50 | 30 days supply | Qty: 30 | Fill #3

## 2018-12-09 ENCOUNTER — Encounter (HOSPITAL_COMMUNITY): Payer: Self-pay | Admitting: Emergency Medicine

## 2018-12-09 ENCOUNTER — Emergency Department (HOSPITAL_COMMUNITY): Payer: BLUE CROSS/BLUE SHIELD

## 2018-12-09 ENCOUNTER — Other Ambulatory Visit: Payer: Self-pay

## 2018-12-09 ENCOUNTER — Emergency Department (HOSPITAL_COMMUNITY)
Admission: EM | Admit: 2018-12-09 | Discharge: 2018-12-09 | Disposition: A | Discharge status: Home / Self Care | Payer: BLUE CROSS/BLUE SHIELD | Attending: Emergency Medicine | Admitting: Emergency Medicine

## 2018-12-09 DIAGNOSIS — Z794 Long term (current) use of insulin: Secondary | ICD-10-CM | POA: Diagnosis not present

## 2018-12-09 DIAGNOSIS — Z79899 Other long term (current) drug therapy: Secondary | ICD-10-CM | POA: Diagnosis not present

## 2018-12-09 DIAGNOSIS — M545 Low back pain, unspecified: Secondary | ICD-10-CM

## 2018-12-09 DIAGNOSIS — E119 Type 2 diabetes mellitus without complications: Secondary | ICD-10-CM | POA: Diagnosis not present

## 2018-12-09 DIAGNOSIS — I10 Essential (primary) hypertension: Secondary | ICD-10-CM | POA: Insufficient documentation

## 2018-12-09 DIAGNOSIS — J45909 Unspecified asthma, uncomplicated: Secondary | ICD-10-CM | POA: Insufficient documentation

## 2018-12-09 LAB — PREGNANCY, URINE: Preg Test, Ur: NEGATIVE

## 2018-12-09 MED ORDER — METHOCARBAMOL 500 MG PO TABS: 500.0000 mg | ORAL_TABLET | Freq: Two times a day (BID) | ORAL | 0 refills | Status: AC | PRN

## 2018-12-09 MED ORDER — NAPROXEN 500 MG PO TABS: 500.0000 mg | ORAL_TABLET | Freq: Two times a day (BID) | ORAL | 0 refills | Status: AC | PRN
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
## 2018-12-09 NOTE — Discharge Instructions (Addendum)
Please read the attachment on low back strain rehab.   Please take your medications, as prescribed. You were given a prescription for Robaxin which is a muscle relaxer.  You should not drive, work, consume alcohol, or operate machinery while taking this medication as it can make you very drowsy.    Please call Dr. Wynelle Link from [facility] in 5 days if your symptoms fail to improve with naproxen and robaxin as well as your stretching exercises.  Please return to the ED or seek medical attention should you develop any groin numbness, leg pain down both legs, fevers or chills, or any other new or worsening symptoms.
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
## 2018-12-10 MED FILL — METHOCARBAMOL 500 MG TABS: 500 | 10 days supply | Qty: 20 | Fill #0

## 2018-12-10 MED FILL — NAPROXEN 500 MG TABS: 500 | 15 days supply | Qty: 30 | Fill #0

## 2018-12-12 ENCOUNTER — Other Ambulatory Visit: Payer: Self-pay

## 2018-12-12 DIAGNOSIS — Z20822 Contact with and (suspected) exposure to covid-19: Secondary | ICD-10-CM

## 2018-12-16 LAB — NOVEL CORONAVIRUS, NAA: SARS-CoV-2, NAA: NOT DETECTED

## 2018-12-16 MED FILL — TOUJEO SOLOSTAR 300 UNITS/M: 300 | 30 days supply | Qty: 3 | Fill #4

## 2018-12-17 MED FILL — FARXIGA 5 MG TABLET: 5 | 30 days supply | Qty: 30 | Fill #2

## 2019-01-07 MED FILL — TOUJEO SOLOSTAR 300 UNITS/M: 300 | 33 days supply | Qty: 5 | Fill #0

## 2019-01-07 MED FILL — SYMBICORT 160-4.5 MCG INH: 160-4.5 | 30 days supply | Qty: 10 | Fill #4

## 2019-01-07 MED FILL — GLIMEPIRIDE 4 MG TABLET: 4 | 30 days supply | Qty: 30 | Fill #4

## 2019-01-07 MED FILL — ALBUTEROL SULFATE HFA 108 (: 108 (90 BAS | 25 days supply | Qty: 9 | Fill #3

## 2019-01-07 MED FILL — FLUTICASONE PROP 50 MCG SPR: 50 | 30 days supply | Qty: 16 | Fill #4

## 2019-01-07 MED FILL — PRAVASTATIN SODIUM 20 MG TA: 20 | 30 days supply | Qty: 30 | Fill #4

## 2019-01-09 MED FILL — LOSARTAN POTASSIUM 50 MG TA: 50 | 30 days supply | Qty: 30 | Fill #4

## 2019-01-09 MED FILL — HYDROCHLOROTHIAZIDE 12.5 MG: 12.5 | 30 days supply | Qty: 30 | Fill #4

## 2019-01-20 MED FILL — FARXIGA 5 MG TABLET: 5 | 30 days supply | Qty: 30 | Fill #3

## 2019-02-06 MED FILL — HYDROCHLOROTHIAZIDE 12.5 MG: 12.5 | 30 days supply | Qty: 30 | Fill #5

## 2019-02-06 MED FILL — FLUTICASONE PROP 50 MCG SPR: 50 | 30 days supply | Qty: 16 | Fill #5

## 2019-02-06 MED FILL — GLIMEPIRIDE 4 MG TABLET: 4 | 30 days supply | Qty: 30 | Fill #5

## 2019-02-06 MED FILL — ALBUTEROL SULFATE HFA 108 (: 108 (90 BAS | 25 days supply | Qty: 9 | Fill #4

## 2019-02-06 MED FILL — LOSARTAN POTASSIUM 50 MG TA: 50 | 30 days supply | Qty: 30 | Fill #5

## 2019-02-06 MED FILL — PRAVASTATIN SODIUM 20 MG TA: 20 | 30 days supply | Qty: 30 | Fill #5

## 2019-02-24 MED FILL — TOUJEO SOLOSTAR 300 UNITS/M: 300 | 33 days supply | Qty: 5 | Fill #1

## 2019-02-24 MED FILL — FARXIGA 5 MG TABLET: 5 | 30 days supply | Qty: 30 | Fill #4

## 2019-03-13 ENCOUNTER — Ambulatory Visit: Payer: BLUE CROSS/BLUE SHIELD | Attending: Internal Medicine

## 2019-03-13 DIAGNOSIS — Z20822 Contact with and (suspected) exposure to covid-19: Secondary | ICD-10-CM

## 2019-03-14 LAB — NOVEL CORONAVIRUS, NAA: SARS-CoV-2, NAA: NOT DETECTED

## 2019-03-18 MED FILL — UNIFINE PENTIPS 8MM 31G: 31G X 8 MM | 90 days supply | Qty: 100 | Fill #1

## 2019-03-18 MED FILL — HYDROCHLOROTHIAZIDE 12.5 MG: 12.5 | 30 days supply | Qty: 30 | Fill #0

## 2019-03-19 MED FILL — LOSARTAN POTASSIUM 50 MG TA: 50 | 30 days supply | Qty: 30 | Fill #0

## 2019-03-19 MED FILL — FARXIGA 5 MG TABLET: 5 | 30 days supply | Qty: 30 | Fill #5

## 2019-03-19 MED FILL — GLIMEPIRIDE 4 MG TABLET: 4 | 30 days supply | Qty: 30 | Fill #0

## 2019-03-19 MED FILL — PRAVASTATIN SODIUM 20 MG TA: 20 | 30 days supply | Qty: 30 | Fill #0

## 2019-04-14 MED FILL — TOUJEO SOLOSTAR 300 UNITS/M: 300 | 33 days supply | Qty: 5 | Fill #2

## 2019-05-09 MED FILL — HYDROCHLOROTHIAZIDE 12.5 MG: 12.5 | 30 days supply | Qty: 30 | Fill #1

## 2019-05-09 MED FILL — TOUJEO SOLOSTAR 300 UNITS/M: 300 | 33 days supply | Qty: 5 | Fill #3

## 2019-05-09 MED FILL — LOSARTAN POTASSIUM 50 MG TA: 50 | 30 days supply | Qty: 30 | Fill #1

## 2019-05-09 MED FILL — GLIMEPIRIDE 4 MG TABS: 4 | 30 days supply | Qty: 30 | Fill #1

## 2019-05-09 MED FILL — PRAVASTATIN SODIUM 20 MG TA: 20 | 30 days supply | Qty: 30 | Fill #1

## 2019-05-12 MED FILL — FARXIGA 5 MG TABLET: 5 | 30 days supply | Qty: 30 | Fill #0

## 2019-05-19 ENCOUNTER — Ambulatory Visit: Payer: BLUE CROSS/BLUE SHIELD | Attending: Internal Medicine

## 2019-05-19 DIAGNOSIS — Z20822 Contact with and (suspected) exposure to covid-19: Secondary | ICD-10-CM

## 2019-05-20 LAB — SARS-COV-2, NAA 2 DAY TAT

## 2019-05-20 LAB — NOVEL CORONAVIRUS, NAA: SARS-CoV-2, NAA: NOT DETECTED

## 2019-06-10 MED FILL — HYDROCHLOROTHIAZIDE 12.5 MG: 12.5 | 30 days supply | Qty: 30 | Fill #2

## 2019-06-10 MED FILL — GLIMEPIRIDE 4 MG TABS: 4 | 30 days supply | Qty: 30 | Fill #2

## 2019-06-10 MED FILL — LOSARTAN POTASSIUM 50 MG TA: 50 | 30 days supply | Qty: 30 | Fill #2

## 2019-06-10 MED FILL — FARXIGA 5 MG TABLET: 5 | 30 days supply | Qty: 30 | Fill #1

## 2019-06-10 MED FILL — PRAVASTATIN SODIUM 20 MG TA: 20 | 30 days supply | Qty: 30 | Fill #2

## 2019-06-11 MED FILL — TOUJEO SOLOSTAR 300 UNITS/M: 300 | 23 days supply | Qty: 5 | Fill #4

## 2019-06-16 ENCOUNTER — Ambulatory Visit: Payer: BLUE CROSS/BLUE SHIELD | Attending: Internal Medicine

## 2019-06-16 DIAGNOSIS — Z20822 Contact with and (suspected) exposure to covid-19: Secondary | ICD-10-CM

## 2019-06-16 IMAGING — MR MR HEAD WO/W CM
12 series · 48 of 48 positions shown · IV contrast (18ml Multihance)
Comparison: CT head 09/15/2013 and CTA head 09/16/2013.

CLINICAL DATA: Frequent headaches in the frontal area.  Nausea.

EXAM:
MRI HEAD WITHOUT AND WITH CONTRAST
TECHNIQUE: Multiplanar, multiecho pulse sequences of the brain and surrounding
structures were obtained without and with intravenous contrast.
CONTRAST:  18mL MULTIHANCE GADOBENATE DIMEGLUMINE 529 MG/ML IV SOLN
Creatinine was obtained on site at [HOSPITAL] at [HOSPITAL].
Results: Creatinine 0.8 mg/dL.

[Series 2: t1_se_sag · sagittal · 5.0mm · 0.45mm/px · 1 of 21 slices shown]
[im 1/21]
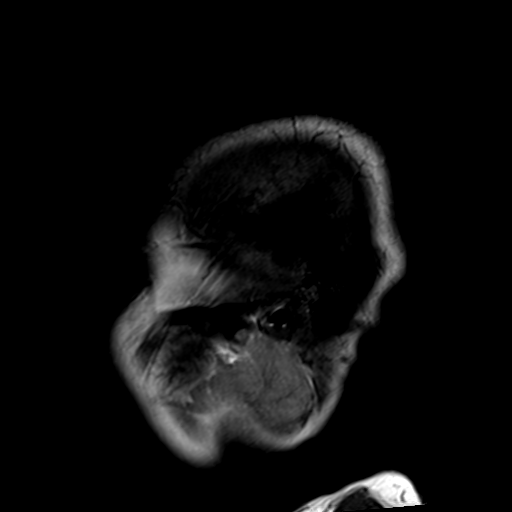

[Series 3: ep2d_diff_(id)_trace · axial · 3.0mm · 1.80mm/px · z∈[-56,+85]mm · 6 of 92 slices shown]
[im 1/92]
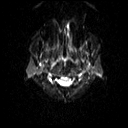
[im 19/92]
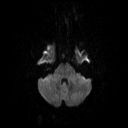
[im 37/92]
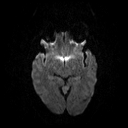
[im 55/92]
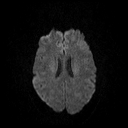
[im 73/92]
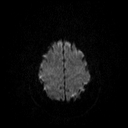
[im 92/92]
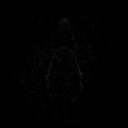

[Series 4: ep2d_diff_(id)_trace_adc · axial · 3.0mm · 1.80mm/px · z∈[-56,+85]mm · 3 of 48 slices shown]
[im 1/48]
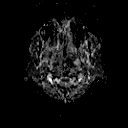
[im 24/48]
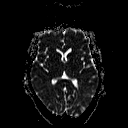
[im 48/48]
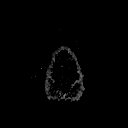

[Series 5: ep2d_diff_cor · coronal · 5.0mm · 1.77mm/px · 3 of 47 slices shown]
[im 1/47]
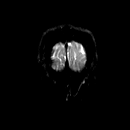
[im 24/47]
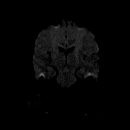
[im 47/47]
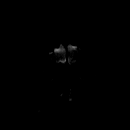

[Series 6: ep2d_diff_cor_adc · coronal · 5.0mm · 1.77mm/px · 2 of 24 slices shown]
[im 1/24]
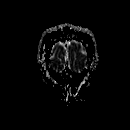
[im 24/24]
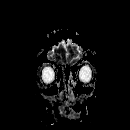

[Series 8: swi_images · axial · 2.0mm · 0.90mm/px · z∈[-65,+93]mm · 5 of 80 slices shown]
[im 1/80]
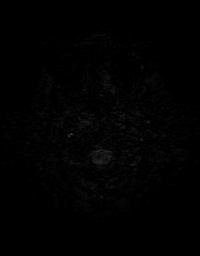
[im 20/80]
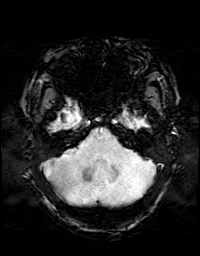
[im 40/80]
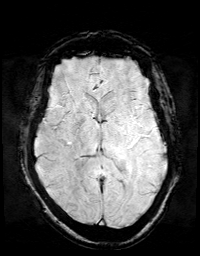
[im 60/80]
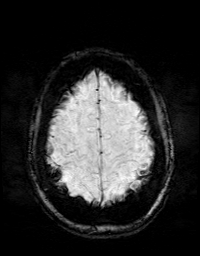
[im 80/80]
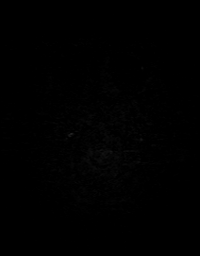

[Series 9: FLAIR · axial · 3.0mm · 0.43mm/px · z∈[-64,+92]mm · 2 of 27 slices shown]
[im 1/27]
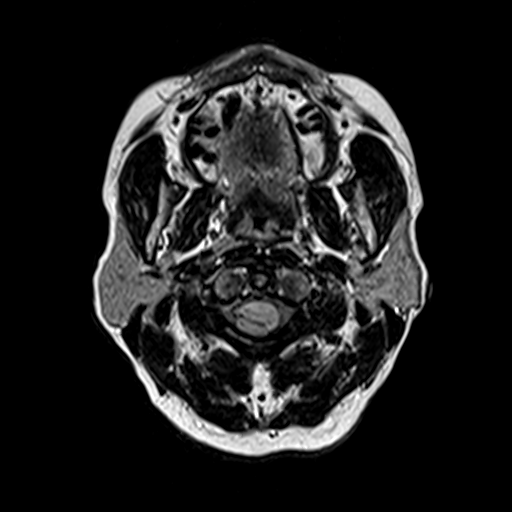
[im 27/27]
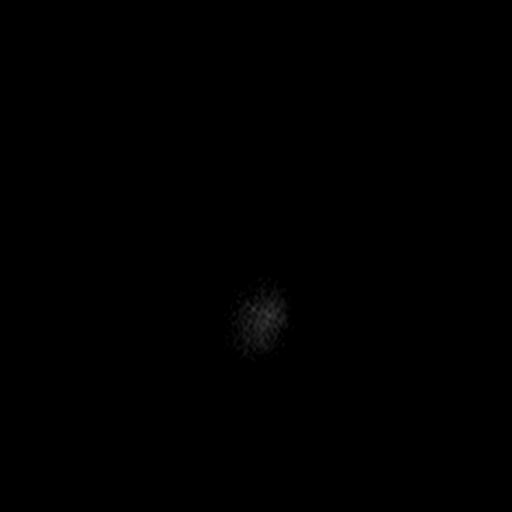

[Series 10: t2_tse_tra_512 · axial · 5.0mm · 0.60mm/px · z∈[-55,+83]mm · 2 of 24 slices shown]
[im 1/24]
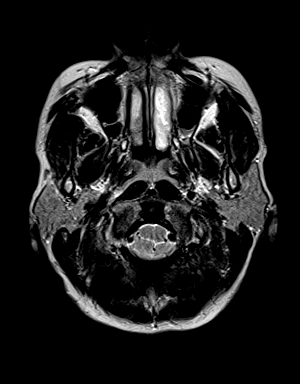
[im 24/24]
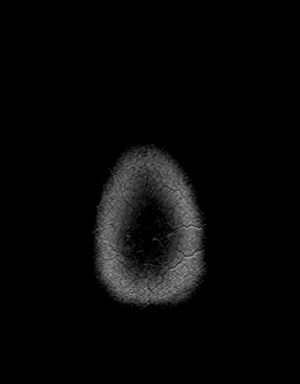

[Series 11: t1_mpr_tra · axial · 1.0mm · 0.72mm/px · z∈[-65,+94]mm · 10 of 160 slices shown]
[im 1/160]
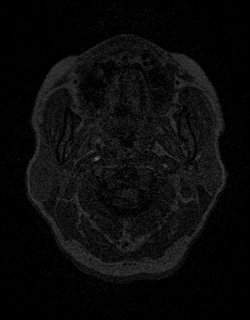
[im 18/160]
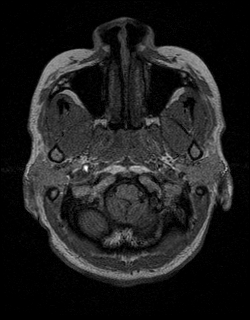
[im 36/160]
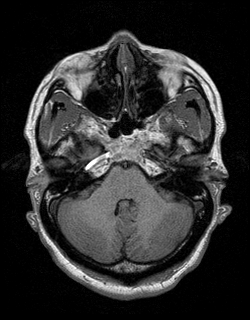
[im 54/160]
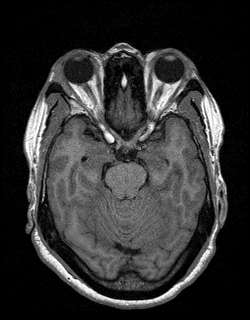
[im 71/160]
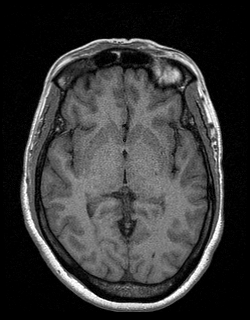
[im 89/160]
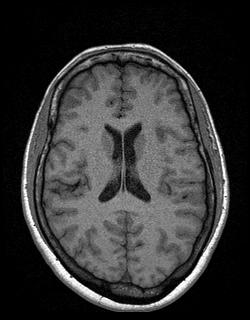
[im 107/160]
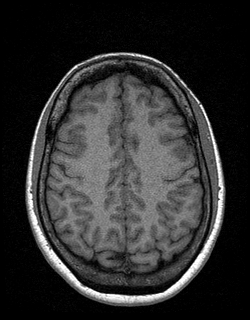
[im 124/160]
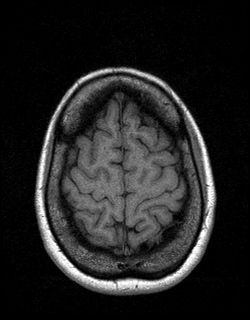
[im 142/160]
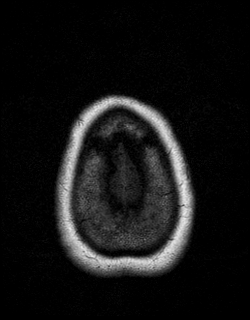
[im 160/160]
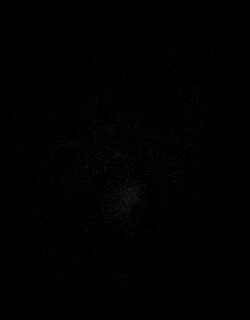

[Series 12: T2 · coronal · 5.0mm · 0.45mm/px · 2 of 26 slices shown]
[im 1/26]
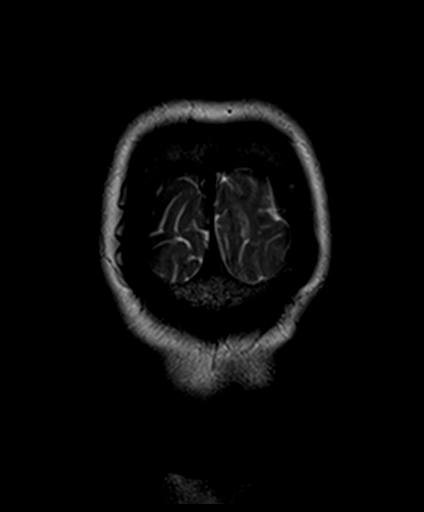
[im 26/26]
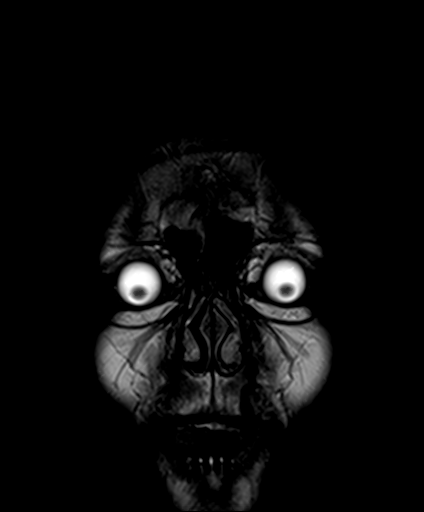

[Series 13: T1 post-contrast · coronal · 5.0mm · 0.72mm/px · 2 of 26 slices shown]
[im 1/26]
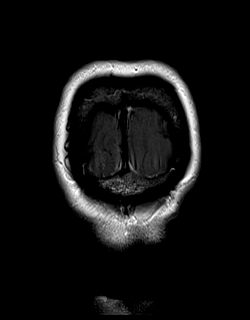
[im 26/26]
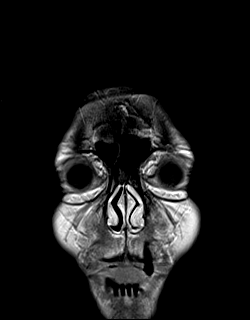

[Series 14: post t1_mpr_tra · axial · 1.0mm · 0.72mm/px · z∈[-65,+94]mm · 10 of 160 slices shown]
[im 1/160]
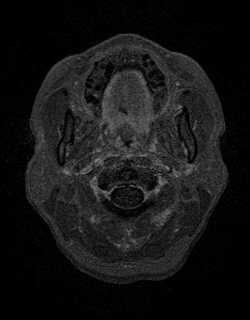
[im 18/160]
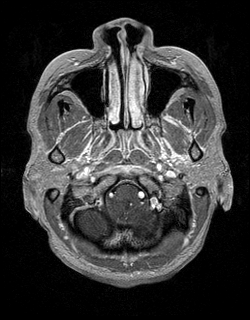
[im 36/160]
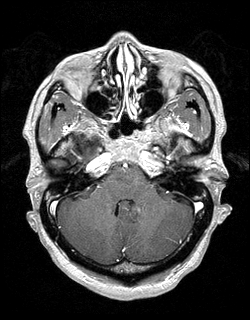
[im 54/160]
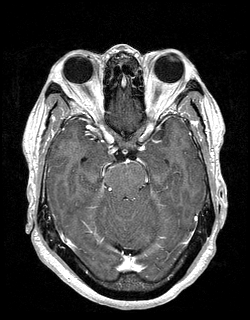
[im 71/160]
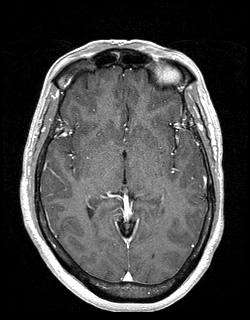
[im 89/160]
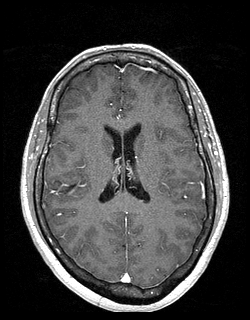
[im 107/160]
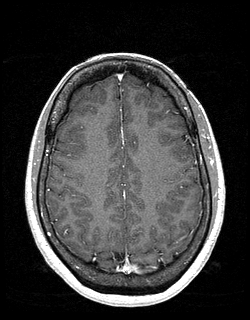
[im 124/160]
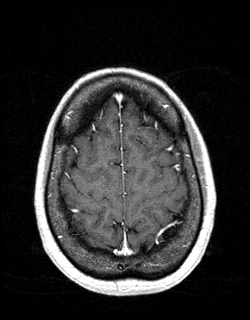
[im 142/160]
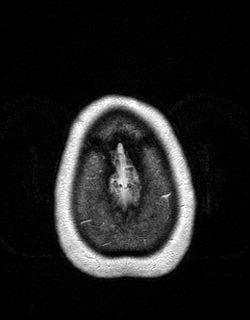
[im 160/160]
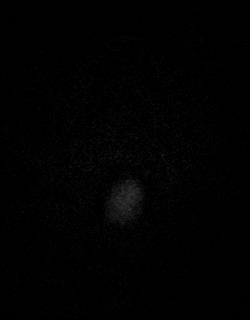

[48 of 48 positions shown; findings below may reference images not displayed]

FINDINGS: Brain: No evidence for acute infarction, hemorrhage, mass lesion,
hydrocephalus, or extra-axial fluid. Normal for age cerebral volume.
No significant white matter disease.

Chiari I malformation, significant downward herniation and impaction
of the cerebellar tonsils, greater on the RIGHT, 2.2 cm below the
foramen magnum. Significant cervicomedullary junction compression.

Post infusion, no abnormal enhancement of the brain or meninges.

Vascular: Flow voids are maintained throughout the carotid, basilar,
and vertebral arteries. There are no areas of chronic hemorrhage.

Skull and upper cervical spine: Normal marrow signal.

Sinuses/Orbits: Clear

Other: None.

Compared with prior CT head and CTA, tonsillar herniation is
difficult to visualize, but was clearly present at that time.
IMPRESSION: Chiari I malformation, significant cerebellar tonsillar herniation
into the upper cervical canal. Greater than 2 cm downward descent,
with cervicomedullary compression. Neurosurgical consultation is
warranted.

## 2019-06-17 LAB — SARS-COV-2, NAA 2 DAY TAT

## 2019-06-17 LAB — NOVEL CORONAVIRUS, NAA: SARS-CoV-2, NAA: NOT DETECTED

## 2019-07-07 MED FILL — HYDROCHLOROTHIAZIDE 12.5 MG: 12.5 | 30 days supply | Qty: 30 | Fill #3

## 2019-07-07 MED FILL — GLIMEPIRIDE 4 MG TABS: 4 | 30 days supply | Qty: 30 | Fill #3

## 2019-07-07 MED FILL — LOSARTAN POTASSIUM 50 MG TA: 50 | 30 days supply | Qty: 30 | Fill #3

## 2019-07-07 MED FILL — FARXIGA 5 MG TABLET: 5 | 30 days supply | Qty: 30 | Fill #2

## 2019-07-07 MED FILL — PRAVASTATIN SODIUM 20 MG TA: 20 | 30 days supply | Qty: 30 | Fill #3

## 2019-07-18 MED FILL — TOUJEO SOLOSTAR 300 UNITS/M: 300 | 23 days supply | Qty: 5 | Fill #5

## 2019-08-07 MED FILL — LOSARTAN POTASSIUM 50 MG TA: 50 | 30 days supply | Qty: 30 | Fill #4

## 2019-08-07 MED FILL — HYDROCHLOROTHIAZIDE 12.5 MG: 12.5 | 30 days supply | Qty: 30 | Fill #4

## 2019-08-07 MED FILL — FARXIGA 5 MG TABLET: 5 | 30 days supply | Qty: 30 | Fill #3

## 2019-08-07 MED FILL — PRAVASTATIN SODIUM 20 MG TA: 20 | 30 days supply | Qty: 30 | Fill #4

## 2019-08-07 MED FILL — GLIMEPIRIDE 4 MG TABS: 4 | 30 days supply | Qty: 30 | Fill #4

## 2019-08-26 ENCOUNTER — Other Ambulatory Visit: Payer: BLUE CROSS/BLUE SHIELD

## 2019-08-26 ENCOUNTER — Other Ambulatory Visit: Payer: Self-pay

## 2019-08-26 DIAGNOSIS — Z20822 Contact with and (suspected) exposure to covid-19: Secondary | ICD-10-CM

## 2019-08-27 LAB — SARS-COV-2, NAA 2 DAY TAT

## 2019-08-27 LAB — NOVEL CORONAVIRUS, NAA: SARS-CoV-2, NAA: NOT DETECTED

## 2019-09-01 MED FILL — LOSARTAN POTASSIUM 50 MG TA: 50 | 30 days supply | Qty: 30 | Fill #5

## 2019-09-01 MED FILL — TOUJEO SOLOSTAR 300 UNITS/M: 300 | 23 days supply | Qty: 5 | Fill #6

## 2019-09-01 MED FILL — PRAVASTATIN SODIUM 20 MG TA: 20 | 30 days supply | Qty: 30 | Fill #5

## 2019-09-01 MED FILL — GLIMEPIRIDE 4 MG TABS: 4 | 30 days supply | Qty: 30 | Fill #5

## 2019-09-02 MED FILL — FARXIGA 5 MG TABLET: 5 | 30 days supply | Qty: 30 | Fill #4

## 2019-09-06 MED FILL — HYDROCHLOROTHIAZIDE 12.5 MG: 12.5 | 30 days supply | Qty: 30 | Fill #0

## 2019-10-07 ENCOUNTER — Other Ambulatory Visit (HOSPITAL_COMMUNITY): Payer: Self-pay | Admitting: Internal Medicine

## 2019-10-07 MED FILL — LOSARTAN POTASSIUM 50 MG TA: 50 | 30 days supply | Qty: 30 | Fill #0

## 2019-10-07 MED FILL — PRAVASTATIN SODIUM 20 MG TA: 20 | 30 days supply | Qty: 30 | Fill #0

## 2019-10-07 MED FILL — HYDROCHLOROTHIAZIDE 12.5 MG: 12.5 | 30 days supply | Qty: 30 | Fill #0

## 2019-10-07 MED FILL — FARXIGA 5 MG TABLET: 5 | 30 days supply | Qty: 30 | Fill #5

## 2019-10-07 MED FILL — TOUJEO SOLOSTAR 300 UNITS/M: 300 | 56 days supply | Qty: 8 | Fill #0

## 2019-10-07 MED FILL — GLIMEPIRIDE 4 MG TABS: 4 | 30 days supply | Qty: 30 | Fill #0

## 2019-11-07 ENCOUNTER — Other Ambulatory Visit (HOSPITAL_COMMUNITY): Payer: Self-pay | Admitting: Internal Medicine

## 2019-11-07 MED FILL — GLIMEPIRIDE 4 MG TABS: 4 | 30 days supply | Qty: 30 | Fill #1

## 2019-11-07 MED FILL — HYDROCHLOROTHIAZIDE 12.5 MG: 12.5 | 30 days supply | Qty: 30 | Fill #1

## 2019-11-07 MED FILL — PRAVASTATIN SODIUM 20 MG TA: 20 | 30 days supply | Qty: 30 | Fill #1

## 2019-11-07 MED FILL — FARXIGA 5 MG TABLET: 5 | 30 days supply | Qty: 30 | Fill #0

## 2019-11-07 MED FILL — LOSARTAN POTASSIUM 50 MG TA: 50 | 30 days supply | Qty: 30 | Fill #1

## 2019-12-09 MED FILL — LOSARTAN POTASSIUM 50 MG TA: 50 | 30 days supply | Qty: 30 | Fill #2

## 2019-12-09 MED FILL — GLIMEPIRIDE 4 MG TABS: 4 | 30 days supply | Qty: 30 | Fill #2

## 2019-12-09 MED FILL — FARXIGA 5 MG TABLET: 5 | 30 days supply | Qty: 30 | Fill #1

## 2019-12-09 MED FILL — PRAVASTATIN SODIUM 20 MG TA: 20 | 30 days supply | Qty: 30 | Fill #2

## 2019-12-09 MED FILL — HYDROCHLOROTHIAZIDE 12.5 MG: 12.5 | 30 days supply | Qty: 30 | Fill #2

## 2019-12-09 MED FILL — TOUJEO SOLOSTAR 300 UNITS/M: 300 | 56 days supply | Qty: 8 | Fill #1

## 2020-01-12 MED FILL — GLIMEPIRIDE 4 MG TABS: 4 | 30 days supply | Qty: 30 | Fill #3

## 2020-01-12 MED FILL — HYDROCHLOROTHIAZIDE 12.5 MG: 12.5 | 30 days supply | Qty: 30 | Fill #3

## 2020-01-12 MED FILL — FARXIGA 5 MG TABLET: 5 | 30 days supply | Qty: 30 | Fill #2

## 2020-01-12 MED FILL — LOSARTAN POTASSIUM 100 MG T: 100 | 30 days supply | Qty: 15 | Fill #0

## 2020-01-12 MED FILL — PRAVASTATIN SODIUM 20 MG TA: 20 | 30 days supply | Qty: 30 | Fill #3

## 2020-02-04 MED FILL — HYDROCHLOROTHIAZIDE 12.5 MG: 12.5 | 30 days supply | Qty: 30 | Fill #4

## 2020-02-04 MED FILL — PRAVASTATIN SODIUM 20 MG TA: 20 | 30 days supply | Qty: 30 | Fill #4

## 2020-02-04 MED FILL — GLIMEPIRIDE 4 MG TABS: 4 | 30 days supply | Qty: 30 | Fill #4

## 2020-02-04 MED FILL — TOUJEO SOLOSTAR 300 UNITS/M: 300 | 56 days supply | Qty: 8 | Fill #2

## 2020-02-04 MED FILL — LOSARTAN POTASSIUM 50 MG TA: 50 | 30 days supply | Qty: 30 | Fill #3

## 2020-02-04 MED FILL — FARXIGA 5 MG TABLET: 5 | 30 days supply | Qty: 30 | Fill #3

## 2020-03-30 ENCOUNTER — Other Ambulatory Visit (HOSPITAL_BASED_OUTPATIENT_CLINIC_OR_DEPARTMENT_OTHER): Payer: Self-pay

## 2020-04-12 ENCOUNTER — Other Ambulatory Visit (HOSPITAL_COMMUNITY): Payer: Self-pay

## 2020-04-27 ENCOUNTER — Other Ambulatory Visit (HOSPITAL_COMMUNITY): Payer: Self-pay

## 2020-04-27 MED FILL — Glimepiride Tab 4 MG: ORAL | 30 days supply | Qty: 30 | Fill #0 | Status: CN

## 2020-04-27 MED FILL — Dapagliflozin Propanediol Tab 5 MG (Base Equivalent): ORAL | 30 days supply | Qty: 30 | Fill #0 | Status: CN

## 2020-04-27 MED FILL — Hydrochlorothiazide Cap 12.5 MG: ORAL | 30 days supply | Qty: 30 | Fill #0 | Status: CN

## 2020-04-27 MED FILL — Pravastatin Sodium Tab 20 MG: ORAL | 30 days supply | Qty: 30 | Fill #0 | Status: CN

## 2020-04-27 MED FILL — Losartan Potassium Tab 50 MG: ORAL | 30 days supply | Qty: 30 | Fill #0 | Status: CN

## 2020-05-13 ENCOUNTER — Other Ambulatory Visit (HOSPITAL_COMMUNITY): Payer: Self-pay

## 2020-05-13 MED FILL — Dapagliflozin Propanediol Tab 5 MG (Base Equivalent): ORAL | 30 days supply | Qty: 30 | Fill #0 | Status: CN

## 2020-05-13 MED FILL — Glimepiride Tab 4 MG: ORAL | 30 days supply | Qty: 30 | Fill #0 | Status: CN

## 2020-05-13 MED FILL — Hydrochlorothiazide Cap 12.5 MG: ORAL | 30 days supply | Qty: 30 | Fill #0 | Status: CN

## 2020-05-13 MED FILL — Insulin Glargine Soln Pen-Injector 300 Unit/ML (1 Unit Dial): SUBCUTANEOUS | 33 days supply | Qty: 4.5 | Fill #0 | Status: CN

## 2020-05-13 MED FILL — Losartan Potassium Tab 50 MG: ORAL | 30 days supply | Qty: 30 | Fill #0 | Status: CN

## 2020-05-13 MED FILL — Pravastatin Sodium Tab 20 MG: ORAL | 30 days supply | Qty: 30 | Fill #0 | Status: CN

## 2020-05-14 ENCOUNTER — Other Ambulatory Visit (HOSPITAL_COMMUNITY): Payer: Self-pay

## 2020-05-18 ENCOUNTER — Other Ambulatory Visit (HOSPITAL_COMMUNITY): Payer: Self-pay

## 2020-05-20 ENCOUNTER — Other Ambulatory Visit (HOSPITAL_COMMUNITY): Payer: Self-pay

## 2020-05-21 ENCOUNTER — Other Ambulatory Visit (HOSPITAL_COMMUNITY): Payer: Self-pay

## 2020-05-22 ENCOUNTER — Other Ambulatory Visit (HOSPITAL_COMMUNITY): Payer: Self-pay

## 2020-05-22 MED FILL — Glimepiride Tab 4 MG: ORAL | 30 days supply | Qty: 30 | Fill #0 | Status: AC

## 2020-05-22 MED FILL — Pravastatin Sodium Tab 20 MG: ORAL | 30 days supply | Qty: 30 | Fill #0 | Status: AC

## 2020-05-22 MED FILL — Dapagliflozin Propanediol Tab 5 MG (Base Equivalent): ORAL | 30 days supply | Qty: 30 | Fill #0 | Status: AC

## 2020-05-22 MED FILL — Insulin Glargine Soln Pen-Injector 300 Unit/ML (1 Unit Dial): SUBCUTANEOUS | 33 days supply | Qty: 3 | Fill #0 | Status: AC

## 2020-05-22 MED FILL — Losartan Potassium Tab 50 MG: ORAL | 30 days supply | Qty: 30 | Fill #0 | Status: AC

## 2020-05-22 MED FILL — Hydrochlorothiazide Cap 12.5 MG: ORAL | 30 days supply | Qty: 30 | Fill #0 | Status: AC

## 2020-05-24 ENCOUNTER — Other Ambulatory Visit (HOSPITAL_COMMUNITY): Payer: Self-pay

## 2020-06-15 ENCOUNTER — Other Ambulatory Visit (HOSPITAL_COMMUNITY): Payer: Self-pay

## 2020-06-16 ENCOUNTER — Other Ambulatory Visit (HOSPITAL_COMMUNITY): Payer: Self-pay

## 2020-06-16 MED FILL — Insulin Glargine Soln Pen-Injector 300 Unit/ML (1 Unit Dial): SUBCUTANEOUS | 56 days supply | Qty: 7.5 | Fill #1 | Status: CN

## 2020-06-16 MED FILL — Insulin Glargine Soln Pen-Injector 300 Unit/ML (1 Unit Dial): SUBCUTANEOUS | 56 days supply | Qty: 7.5 | Fill #1 | Status: AC

## 2020-06-16 MED FILL — Losartan Potassium Tab 50 MG: ORAL | 30 days supply | Qty: 30 | Fill #1 | Status: AC

## 2020-06-16 MED FILL — Dapagliflozin Propanediol Tab 5 MG (Base Equivalent): ORAL | 30 days supply | Qty: 30 | Fill #1 | Status: AC

## 2020-06-17 ENCOUNTER — Other Ambulatory Visit (HOSPITAL_COMMUNITY): Payer: Self-pay

## 2020-06-19 ENCOUNTER — Encounter (HOSPITAL_COMMUNITY): Payer: Self-pay

## 2020-06-19 ENCOUNTER — Emergency Department (HOSPITAL_COMMUNITY): Payer: BLUE CROSS/BLUE SHIELD

## 2020-06-19 ENCOUNTER — Other Ambulatory Visit: Payer: Self-pay

## 2020-06-19 ENCOUNTER — Emergency Department (HOSPITAL_COMMUNITY)
Admission: EM | Admit: 2020-06-19 | Discharge: 2020-06-19 | Disposition: A | Discharge status: Home / Self Care | Payer: BLUE CROSS/BLUE SHIELD | Attending: Emergency Medicine | Admitting: Emergency Medicine

## 2020-06-19 DIAGNOSIS — Z794 Long term (current) use of insulin: Secondary | ICD-10-CM | POA: Diagnosis not present

## 2020-06-19 DIAGNOSIS — E119 Type 2 diabetes mellitus without complications: Secondary | ICD-10-CM | POA: Insufficient documentation

## 2020-06-19 DIAGNOSIS — J45909 Unspecified asthma, uncomplicated: Secondary | ICD-10-CM | POA: Insufficient documentation

## 2020-06-19 DIAGNOSIS — I10 Essential (primary) hypertension: Secondary | ICD-10-CM | POA: Insufficient documentation

## 2020-06-19 DIAGNOSIS — Z7984 Long term (current) use of oral hypoglycemic drugs: Secondary | ICD-10-CM | POA: Diagnosis not present

## 2020-06-19 DIAGNOSIS — Z79899 Other long term (current) drug therapy: Secondary | ICD-10-CM | POA: Insufficient documentation

## 2020-06-19 DIAGNOSIS — Y9289 Other specified places as the place of occurrence of the external cause: Secondary | ICD-10-CM | POA: Diagnosis not present

## 2020-06-19 DIAGNOSIS — M25562 Pain in left knee: Secondary | ICD-10-CM | POA: Insufficient documentation

## 2020-06-19 DIAGNOSIS — W010XXA Fall on same level from slipping, tripping and stumbling without subsequent striking against object, initial encounter: Secondary | ICD-10-CM | POA: Diagnosis not present

## 2020-06-19 MED ORDER — OXYCODONE-ACETAMINOPHEN 5-325 MG PO TABS
1.0000 | ORAL_TABLET | Freq: Once | ORAL | Status: AC
  Administered 2020-06-19: 1 via ORAL
  Filled 2020-06-19: qty 1

## 2020-06-19 NOTE — Discharge Instructions (Addendum)
You came to the emergency department today to be evaluated for your left knee pain after suffering a fall.  Your x-ray showed no broken bones, dislocations, or soft tissue swelling.  I placed you in a knee immobilizer and given you crutches.  Please continue to use this until you can follow-up with an orthopedic provider.  Please take Ibuprofen (Advil, motrin) and Tylenol (acetaminophen) to relieve your pain.    You may take up to 600 MG (3 pills) of normal strength ibuprofen every 8 hours as needed.   You make take tylenol, up to 1,000 mg (two extra strength pills) every 8 hours as needed.   It is safe to take ibuprofen and tylenol at the same time as they work differently.   Do not take more than 3,000 mg tylenol in a 24 hour period (not more than one dose every 8 hours.  Please check all medication labels as many medications such as pain and cold medications may contain tylenol.  Do not drink alcohol while taking these medications.  Do not take other NSAID'S while taking ibuprofen (such as aleve or naproxen).  Please take ibuprofen with food to decrease stomach upset.  Get help right away if: Your knee swells, and the swelling becomes worse. You cannot move your knee. You have severe pain in your knee that cannot be managed with pain medicine.

## 2020-06-19 NOTE — Progress Notes (Signed)
Orthopedic Tech Progress Note Patient Details:  Leslie Wood 1965/08/13 [id number]  Ortho Devices Type of Ortho Device: Knee Immobilizer, Crutches Ortho Device/Splint Location: LLE Ortho Device/Splint Interventions: Ordered, Application   Post Interventions Patient Tolerated: Well Instructions Provided: Adjustment of device  Maurene Capes 06/19/2020, 11:04 AM

## 2020-06-19 NOTE — ED Notes (Signed)
Ortho at the bedside.

## 2020-06-19 NOTE — ED Triage Notes (Signed)
Pt BIB EMS. Pt had a mechincal fall last night around 10p due to slipping on a wet floor. No LOC, no blood thinners. Pt stated she woke up this morning with left knee pain and swelling. Pt is unable to bear weight on this knee.

## 2020-06-19 NOTE — ED Provider Notes (Signed)
[facility]-EMERGENCY DEPT Provider Note   CSN: [id number] Arrival date & time: 06/19/20  7741     History Chief Complaint  Patient presents with   Knee Pain    Vandora Jaskulski is a 55 y.o. female with a history of asthma, diabetes, hypertension, and obesity.  Patient presents to the emergency department with a chief complaint of left knee pain.  Patient reports that she had mechanical fall last night approximately 2200.  Patient states that she was wearing socks and slipped on the floor in the kitchen because it was slippery.  Patient denies hitting her head or any loss of consciousness.  Patient reports that she felt like her left knee twisted and she landed with it in a bent position.  Patient was able to stand and ambulate after this fall.  She reports waking this morning with increased pain to left knee and reported swelling.  Patient reports that she is having no pain with me at rest.  With movement or ambulation patient rates pain 10/10 on the pain scale.  She has had difficulty ambulating due to her pain today.  Patient denies any neck pain, back pain, visual disturbance, bowel or bladder dysfunction, numbness to extremities, weakness of extremities, visual disturbance, nausea, vomiting, rash, erythema.    Patient is not on any blood thinners.   Knee Pain Associated symptoms: no back pain, no fever and no neck pain        Past Medical History:  Diagnosis Date   Asthma    Diabetes mellitus without complication (HCC)    Hypercholesterolemia    Hypertension    Obesity     Patient Active Problem List   Diagnosis Date Noted   Substernal chest pain 09/17/2016   Diabetes mellitus without complication (HCC) 09/17/2016   Asthma 09/17/2016   Hypercholesterolemia 09/17/2016   Benign essential HTN 09/17/2016    Past Surgical History:  Procedure Laterality Date   CESAREAN SECTION     IR REMOVAL OF CALCULI/DEBRIS BILIARY DUCT/GB       OB History   No  obstetric history on file.     Family History  Problem Relation Age of Onset   Hypertension Mother    Diabetes Mother    Asthma Mother    High blood pressure Father    High blood pressure Sister    Diabetes Sister    Asthma Sister     Social History   Tobacco Use   Smoking status: Never   Smokeless tobacco: Never  Vaping Use   Vaping Use: Never used  Substance Use Topics   Alcohol use: No   Drug use: No    Home Medications Prior to Admission medications   Medication Sig Start Date End Date Taking? Authorizing Provider  acetaminophen (TYLENOL) 500 MG tablet Take 1,000 mg by mouth every 6 (six) hours as needed for headache (pain).    [provider]  albuterol (PROAIR HFA) 108 (90 Base) MCG/ACT inhaler Inhale 2 puffs into the lungs every 6 (six) hours as needed for wheezing or shortness of breath.    [provider]  dapagliflozin propanediol (FARXIGA) 5 MG TABS tablet TAKE 1 TABLET BY MOUTH ONCE DAILY 11/07/19 11/06/20  Fleet Contras, MD  diazepam (VALIUM) 5 MG tablet  07/20/17   [provider]  glimepiride (AMARYL) 4 MG tablet Take 4 mg by mouth daily before breakfast.    [provider]  glimepiride (AMARYL) 4 MG tablet TAKE 1 TABLET BY MOUTH ONCE A  DAY 10/07/19 10/06/20  Fleet Contras, MD  hydrochlorothiazide (MICROZIDE) 12.5 MG capsule TAKE 1 CAPSULE BY MOUTH ONCE DAILY 10/07/19 10/06/20  Fleet Contras, MD  ibuprofen (ADVIL,MOTRIN) 200 MG tablet Take 400 mg by mouth every 6 (six) hours as needed for headache (pain).     [provider]  Insulin Glargine (TOUJEO SOLOSTAR) 300 UNIT/ML SOPN Inject 30 Units into the skin daily.    [provider]  insulin glargine, 1 Unit Dial, (TOUJEO) 300 UNIT/ML Solostar Pen INJECT 40 UNITS UNDER THE SKIN EVERY MORNING 10/07/19 10/06/20  Fleet Contras, MD  losartan (COZAAR) 50 MG tablet TAKE 1 TABLET BY MOUTH ONCE A DAY (TAKE WITH HCTZ) 10/07/19 10/06/20  Fleet Contras, MD   losartan-hydrochlorothiazide (HYZAAR) 50-12.5 MG tablet Take 1 tablet by mouth daily.  09/04/16   [provider]  methocarbamol (ROBAXIN) 500 MG tablet Take 1 tablet (500 mg total) by mouth 2 (two) times daily between meals as needed for muscle spasms. 12/09/18   Lorelee New, PA-C  naproxen (NAPROSYN) 500 MG tablet Take 1 tablet (500 mg total) by mouth 2 (two) times daily between meals as needed for moderate pain. 12/09/18   Lorelee New, PA-C  ondansetron (ZOFRAN) 4 MG tablet Take 1 tablet (4 mg total) by mouth every 6 (six) hours as needed for nausea or vomiting. 07/06/13   Dione Booze, MD  pravastatin (PRAVACHOL) 20 MG tablet Take 20 mg by mouth daily.    [provider]  pravastatin (PRAVACHOL) 20 MG tablet TAKE 1 TABLET BY MOUTH ONCE DAILY 10/07/19 10/06/20  Fleet Contras, MD  SYMBICORT 160-4.5 MCG/ACT inhaler Inhale 2 puffs into the lungs as needed (Shortness of breath).  01/11/17   [provider]  Topiramate ER (TROKENDI XR) 50 MG CP24 Take 1 capsule by mouth at bedtime. Patient not taking: Reported on 07/31/2017 04/17/17   Drema Dallas, DO    Allergies    Patient has no allergy information on record.  Review of Systems   Review of Systems  Constitutional:  Negative for chills and fever.  HENT:  Negative for facial swelling.   Eyes:  Negative for visual disturbance.  Respiratory:  Negative for shortness of breath.   Cardiovascular:  Negative for chest pain.  Gastrointestinal:  Negative for abdominal pain, nausea and vomiting.  Genitourinary:  Negative for difficulty urinating.  Musculoskeletal:  Positive for arthralgias, gait problem and joint swelling. Negative for back pain and neck pain.  Skin:  Negative for color change, pallor, rash and wound.  Neurological:  Negative for dizziness, tremors, syncope, facial asymmetry, speech difficulty, light-headedness, numbness and headaches.  Psychiatric/Behavioral:  Negative for confusion.    Physical  Exam Updated Vital Signs BP (!) 151/119 (BP Location: Left Arm)   Pulse (!) 104   Temp 98.3 F (36.8 C) (Oral)   Resp 17   Ht 5\' 5"  (1.651 m)   Wt 90.7 kg   LMP 05/22/2012   SpO2 97%   BMI 33.28 kg/m   Physical Exam Vitals and nursing note reviewed.  Constitutional:      General: She is not in acute distress.    Appearance: She is not ill-appearing, toxic-appearing or diaphoretic.  HENT:     Head: Normocephalic and atraumatic. No raccoon eyes, Battle's sign, abrasion, contusion, masses, right periorbital erythema, left periorbital erythema or laceration.     Jaw: No trismus or pain on movement.     Mouth/Throat:     Pharynx: Oropharynx is clear. Uvula midline. No pharyngeal  swelling, oropharyngeal exudate, posterior oropharyngeal erythema or uvula swelling.  Eyes:     General: No scleral icterus.       Right eye: No discharge.        Left eye: No discharge.     Extraocular Movements: Extraocular movements intact.     Pupils: Pupils are equal, round, and reactive to light.  Cardiovascular:     Rate and Rhythm: Normal rate.     Pulses:          Dorsalis pedis pulses are 3+ on the right side and 3+ on the left side.  Pulmonary:     Effort: Pulmonary effort is normal. No respiratory distress.  Musculoskeletal:     Cervical back: Normal range of motion and neck supple. No swelling, edema, deformity, erythema, signs of trauma, lacerations, rigidity, spasms, torticollis, tenderness, bony tenderness or crepitus. No pain with movement. Normal range of motion.     Thoracic back: No swelling, edema, deformity, signs of trauma, lacerations, spasms, tenderness or bony tenderness.     Lumbar back: No swelling, edema, deformity, signs of trauma, lacerations, spasms, tenderness or bony tenderness.     Right knee: No swelling, deformity, effusion, erythema, ecchymosis, lacerations, bony tenderness or crepitus. Normal range of motion. No tenderness. Normal alignment.     Instability Tests:  Anterior drawer test negative. Posterior drawer test negative.     Left knee: Swelling (Normal swelling to medial aspect) present. No deformity, effusion, erythema, ecchymosis, lacerations, bony tenderness or crepitus. Decreased range of motion (Decreased flexion). Tenderness present over the medial joint line. Normal alignment and normal patellar mobility.     Instability Tests: Anterior drawer test positive. Posterior drawer test negative.     Right lower leg: Normal.     Left lower leg: Normal.     Right ankle: No swelling, deformity, ecchymosis or lacerations. No tenderness. Normal range of motion.     Left ankle: No swelling, deformity, ecchymosis or lacerations. No tenderness. Normal range of motion.     Right foot: Normal range of motion and normal capillary refill. No swelling, deformity, laceration, tenderness or bony tenderness. Normal pulse.     Left foot: Normal range of motion and normal capillary refill. No swelling, deformity, laceration, tenderness or bony tenderness. Normal pulse.     Comments: She has no midline tenderness or deformity to cervical, thoracic, or lumbar spine No tenderness, bony tenderness, or deformity to bilateral upper extremities and right lower extremity  Skin:    General: Skin is warm and dry.  Neurological:     General: No focal deficit present.     Mental Status: She is alert.     GCS: GCS eye subscore is 4. GCS verbal subscore is 5. GCS motor subscore is 6.     Cranial Nerves: No cranial nerve deficit or facial asymmetry.     Sensory: Sensation is intact.     Motor: No weakness, tremor, seizure activity or pronator drift.     Coordination: Finger-Nose-Finger Test normal.     Comments: CN II-XII intact, equal grip strength, +5 strength to bilateral upper, station to light touch intact to bilateral upper and lower extremities   Psychiatric:        Behavior: Behavior is cooperative.    ED Results / Procedures / Treatments   Labs (all labs ordered  are listed, but only abnormal results are displayed) Labs Reviewed - No data to display  EKG None  Radiology DG Knee Complete 4 Views Left  Result  Date: 06/19/2020 CLINICAL DATA:  Fall last night with left knee pain. EXAM: LEFT KNEE - COMPLETE 4+ VIEW COMPARISON:  None. FINDINGS: No evidence of fracture, dislocation, or joint effusion. No evidence of arthropathy or other focal bone abnormality. Soft tissues are unremarkable. IMPRESSION: Negative. Electronically Signed   By: Elberta Fortis M.D.   On: 06/19/2020 09:50    Procedures Procedures   Medications Ordered in ED Medications  oxyCODONE-acetaminophen (PERCOCET/ROXICET) 5-325 MG per tablet 1 tablet (1 tablet Oral Given 06/19/20 3716)    ED Course  I have reviewed the triage vital signs and the nursing notes.  Pertinent labs & imaging results that were available during my care of the patient were reviewed by me and considered in my medical decision making (see chart for details).    MDM Rules/Calculators/A&P                          Alert 55 year old female in no acute distress, nontoxic-appearing.  Patient presents emergency department with a chief complaint of left knee pain after suffering a mechanical fall last night.  Patient denies hitting her head or any loss of consciousness.  Patient is not on any blood thinners.  No focal neurological deficits on physical exam.  No midline tenderness or deformity to cervical, thoracic, or lumbar spine.  Head is atraumatic.  Patient denies any neck pain, back pain, bowel or bladder dysfunction, saddle anesthesia, visual disturbance, numbness, weakness.  Low suspicion for intracranial or spinal injury at this time.  Knee exam as noted above.  X-ray imaging of left knee shows no evidence of fracture, dislocation, joint effusion, other focal bone abnormality, or soft tissue abnormality.  Will place patient in knee immobilizer and give her crutches for nonweightbearing status.  Patient vies to  use over-the-counter pain medication as needed.  Patient advised to elevate and ice her knee.  Patient to follow-up with orthopedic provider.  Patient given strict return precautions.  Patient expressed understanding of all instructions and is agreeable with this plan.  Final Clinical Impression(s) / ED Diagnoses Final diagnoses:  Acute pain of left knee    Rx / DC Orders ED Discharge Orders     None        Berneice Heinrich 06/19/20 1859    Mancel Bale, MD 06/20/20 1816

## 2020-06-21 ENCOUNTER — Other Ambulatory Visit (HOSPITAL_COMMUNITY): Payer: Self-pay

## 2020-06-22 ENCOUNTER — Other Ambulatory Visit (HOSPITAL_COMMUNITY): Payer: Self-pay

## 2020-06-26 ENCOUNTER — Other Ambulatory Visit (HOSPITAL_COMMUNITY): Payer: Self-pay

## 2020-07-02 ENCOUNTER — Other Ambulatory Visit (HOSPITAL_COMMUNITY): Payer: Self-pay

## 2020-07-13 ENCOUNTER — Other Ambulatory Visit (HOSPITAL_COMMUNITY): Payer: Self-pay

## 2020-07-23 ENCOUNTER — Other Ambulatory Visit (HOSPITAL_COMMUNITY): Payer: Self-pay

## 2021-01-22 ENCOUNTER — Other Ambulatory Visit: Payer: Self-pay

## 2021-01-22 ENCOUNTER — Emergency Department (HOSPITAL_COMMUNITY)
Admission: EM | Admit: 2021-01-22 | Discharge: 2021-01-23 | Disposition: A | Discharge status: Home / Self Care | Payer: BLUE CROSS/BLUE SHIELD | Attending: Emergency Medicine | Admitting: Emergency Medicine

## 2021-01-22 DIAGNOSIS — Z28310 Unvaccinated for covid-19: Secondary | ICD-10-CM | POA: Insufficient documentation

## 2021-01-22 DIAGNOSIS — R509 Fever, unspecified: Secondary | ICD-10-CM | POA: Diagnosis present

## 2021-01-22 DIAGNOSIS — Z794 Long term (current) use of insulin: Secondary | ICD-10-CM | POA: Insufficient documentation

## 2021-01-22 DIAGNOSIS — R739 Hyperglycemia, unspecified: Secondary | ICD-10-CM

## 2021-01-22 DIAGNOSIS — E1165 Type 2 diabetes mellitus with hyperglycemia: Secondary | ICD-10-CM | POA: Diagnosis not present

## 2021-01-22 DIAGNOSIS — U071 COVID-19: Secondary | ICD-10-CM | POA: Insufficient documentation

## 2021-01-22 DIAGNOSIS — R112 Nausea with vomiting, unspecified: Secondary | ICD-10-CM | POA: Diagnosis not present

## 2021-01-22 DIAGNOSIS — R000 Tachycardia, unspecified: Secondary | ICD-10-CM | POA: Insufficient documentation

## 2021-01-22 DIAGNOSIS — E860 Dehydration: Secondary | ICD-10-CM | POA: Diagnosis not present

## 2021-01-22 DIAGNOSIS — Z7984 Long term (current) use of oral hypoglycemic drugs: Secondary | ICD-10-CM | POA: Insufficient documentation

## 2021-01-22 NOTE — ED Triage Notes (Signed)
PER EMS: pt from home with c/o n/v, productive cough, fever, chills, lack of appetite and back pain. She tested + for Covid four days ago.  HR-130, BP-102/60, 250 cc NaCl give en route and 4mg  of Zofran given, HR -116

## 2021-01-23 ENCOUNTER — Encounter (HOSPITAL_COMMUNITY): Payer: Self-pay

## 2021-01-23 ENCOUNTER — Emergency Department (HOSPITAL_COMMUNITY): Payer: BLUE CROSS/BLUE SHIELD

## 2021-01-23 ENCOUNTER — Other Ambulatory Visit: Payer: Self-pay

## 2021-01-23 LAB — CBC WITH DIFFERENTIAL/PLATELET
Abs Immature Granulocytes: 0.01 10*3/uL (ref 0.00–0.07)
Basophils Absolute: 0 10*3/uL (ref 0.0–0.1)
Basophils Relative: 0 %
Eosinophils Absolute: 0 10*3/uL (ref 0.0–0.5)
Eosinophils Relative: 0 %
HCT: 47.1 % — ABNORMAL HIGH (ref 36.0–46.0)
Hemoglobin: 14.5 g/dL (ref 12.0–15.0)
Immature Granulocytes: 0 %
Lymphocytes Relative: 23 %
Lymphs Abs: 1.3 10*3/uL (ref 0.7–4.0)
MCH: 26.9 pg (ref 26.0–34.0)
MCHC: 30.8 g/dL (ref 30.0–36.0)
MCV: 87.2 fL (ref 80.0–100.0)
Monocytes Absolute: 0.9 10*3/uL (ref 0.1–1.0)
Monocytes Relative: 16 %
Neutro Abs: 3.4 10*3/uL (ref 1.7–7.7)
Neutrophils Relative %: 61 %
Platelets: 232 10*3/uL (ref 150–400)
RBC: 5.4 MIL/uL — ABNORMAL HIGH (ref 3.87–5.11)
RDW: 13.8 % (ref 11.5–15.5)
WBC: 5.7 10*3/uL (ref 4.0–10.5)
nRBC: 0 % (ref 0.0–0.2)

## 2021-01-23 LAB — COMPREHENSIVE METABOLIC PANEL
ALT: 43 U/L (ref 0–44)
AST: 49 U/L — ABNORMAL HIGH (ref 15–41)
Albumin: 3.4 g/dL — ABNORMAL LOW (ref 3.5–5.0)
Alkaline Phosphatase: 129 U/L — ABNORMAL HIGH (ref 38–126)
Anion gap: 19 — ABNORMAL HIGH (ref 5–15)
BUN: 22 mg/dL — ABNORMAL HIGH (ref 6–20)
CO2: 18 mmol/L — ABNORMAL LOW (ref 22–32)
Calcium: 8.8 mg/dL — ABNORMAL LOW (ref 8.9–10.3)
Chloride: 92 mmol/L — ABNORMAL LOW (ref 98–111)
Creatinine, Ser: 1.35 mg/dL — ABNORMAL HIGH (ref 0.44–1.00)
GFR, Estimated: 46 mL/min — ABNORMAL LOW (ref >60)
Glucose, Bld: 397 mg/dL — ABNORMAL HIGH (ref 70–99)
Potassium: 4.2 mmol/L (ref 3.5–5.1)
Sodium: 129 mmol/L — ABNORMAL LOW (ref 135–145)
Total Bilirubin: 1.2 mg/dL (ref 0.3–1.2)
Total Protein: 8.2 g/dL — ABNORMAL HIGH (ref 6.5–8.1)

## 2021-01-23 LAB — BLOOD GAS, VENOUS
Acid-base deficit: 3.6 mmol/L — ABNORMAL HIGH (ref 0.0–2.0)
Bicarbonate: 21.8 mmol/L (ref 20.0–28.0)
FIO2: 21
O2 Saturation: 78 %
Patient temperature: 98.6
pCO2, Ven: 42.7 mmHg — ABNORMAL LOW (ref 44.0–60.0)
pH, Ven: 7.328 (ref 7.250–7.430)
pO2, Ven: 45 mmHg (ref 32.0–45.0)

## 2021-01-23 LAB — BASIC METABOLIC PANEL
Anion gap: 9 (ref 5–15)
BUN: 21 mg/dL — ABNORMAL HIGH (ref 6–20)
CO2: 22 mmol/L (ref 22–32)
Calcium: 8.2 mg/dL — ABNORMAL LOW (ref 8.9–10.3)
Chloride: 101 mmol/L (ref 98–111)
Creatinine, Ser: 1.1 mg/dL — ABNORMAL HIGH (ref 0.44–1.00)
GFR, Estimated: 59 mL/min — ABNORMAL LOW (ref >60)
Glucose, Bld: 396 mg/dL — ABNORMAL HIGH (ref 70–99)
Potassium: 3.9 mmol/L (ref 3.5–5.1)
Sodium: 132 mmol/L — ABNORMAL LOW (ref 135–145)

## 2021-01-23 LAB — CBG MONITORING, ED
Glucose-Capillary: 437 mg/dL — ABNORMAL HIGH (ref 70–99)
Glucose-Capillary: 391 mg/dL — ABNORMAL HIGH (ref 70–99)

## 2021-01-23 MED ORDER — SODIUM CHLORIDE 0.9 % IV SOLN: Freq: Once | INTRAVENOUS | Status: AC

## 2021-01-23 MED ORDER — ACETAMINOPHEN 325 MG PO TABS
650.0000 mg | ORAL_TABLET | Freq: Once | ORAL | Status: AC
  Administered 2021-01-23: 650 mg via ORAL
  Filled 2021-01-23: qty 2

## 2021-01-23 MED ORDER — INSULIN GLARGINE (1 UNIT DIAL) 300 UNIT/ML ~~LOC~~ SOPN
PEN_INJECTOR | SUBCUTANEOUS | 5 refills | Status: AC
Start: 2021-01-23 — End: 2021-02-22

## 2021-01-23 MED ORDER — NIRMATRELVIR/RITONAVIR (PAXLOVID) TABLET (RENAL DOSING): 2.0000 | ORAL_TABLET | Freq: Two times a day (BID) | ORAL | 0 refills | Status: AC

## 2021-01-23 MED ORDER — INSULIN ASPART 100 UNIT/ML IJ SOLN
8.0000 [IU] | Freq: Once | INTRAMUSCULAR | Status: AC
  Administered 2021-01-23: 8 [IU] via SUBCUTANEOUS
  Filled 2021-01-23: qty 0.08

## 2021-01-23 MED ORDER — SODIUM CHLORIDE 0.9 % IV BOLUS
500.0000 mL | Freq: Once | INTRAVENOUS | Status: AC
  Administered 2021-01-23: 500 mL via INTRAVENOUS

## 2021-01-23 MED ORDER — SODIUM CHLORIDE 0.9 % IV BOLUS
1000.0000 mL | Freq: Once | INTRAVENOUS | Status: AC
  Administered 2021-01-23: 1000 mL via INTRAVENOUS

## 2021-01-23 NOTE — ED Notes (Signed)
Kelly, PA at bedside. 

## 2021-01-23 NOTE — ED Notes (Signed)
Ambulated pt around room. O2 maintained 90-93%

## 2021-01-23 NOTE — ED Notes (Signed)
Cab called.

## 2021-01-23 NOTE — ED Notes (Addendum)
Ambulated patient. SPO2 during ambulation 94-96. SPO2 back in bed 90-92. Patient slight unsteady on feet and needed 1 assist.

## 2021-01-23 NOTE — ED Notes (Signed)
Patient transported to X-ray 

## 2021-01-23 NOTE — ED Notes (Signed)
Patient struggling to ambulate safely without assistance. PA made aware.

## 2021-01-23 NOTE — ED Provider Notes (Signed)
[facility]   CSN: [id number] Arrival date & time: 01/22/21  2347     History  Chief Complaint  Patient presents with   Fever   Cough    Leslie FlakesGail Wood is a 56 y.o. female.  Tested positive for COVID at home on Tuesday when symptoms began. Has never had COVID or been vaccinated in the past. Out of her insulin for DM since the summer months. Has not followed with a PCP in 2 years, per patient.  The history is provided by the patient. No language interpreter was used.  Fever Temp source:  Subjective Severity:  Moderate Duration:  4 days Timing:  Intermittent Progression:  Waxing and waning Chronicity:  New Relieved by:  Nothing Ineffective treatments:  None tried Associated symptoms: chills, cough, myalgias, nausea and vomiting   Associated symptoms: no diarrhea, no rhinorrhea and no sore throat   Myalgias:    Location:  Back   Quality:  Aching   Duration:  2 days   Timing:  Constant   Progression:  Waxing and waning Cough Associated symptoms: chills, fever and myalgias   Associated symptoms: no rhinorrhea and no sore throat       Home Medications Prior to Admission medications   Medication Sig Start Date End Date Taking? Authorizing Provider  acetaminophen (TYLENOL) 500 MG tablet Take 1,000 mg by mouth every 6 (six) hours as needed for headache (pain).    [provider]  albuterol (PROAIR HFA) 108 (90 Base) MCG/ACT inhaler Inhale 2 puffs into the lungs every 6 (six) hours as needed for wheezing or shortness of breath.    [provider]  diazepam (VALIUM) 5 MG tablet  07/20/17   [provider]  glimepiride (AMARYL) 4 MG tablet Take 4 mg by mouth daily before breakfast.    [provider]  glimepiride (AMARYL) 4 MG tablet TAKE 1 TABLET BY MOUTH ONCE A DAY 10/07/19 10/06/20  [name], [name], MD  hydrochlorothiazide (MICROZIDE) 12.5 MG capsule TAKE 1 CAPSULE BY MOUTH ONCE DAILY 10/07/19  10/06/20  [name], [name], MD  ibuprofen (ADVIL,MOTRIN) 200 MG tablet Take 400 mg by mouth every 6 (six) hours as needed for headache (pain).     [provider]  Insulin Glargine (TOUJEO SOLOSTAR) 300 UNIT/ML SOPN Inject 30 Units into the skin daily.    [provider]  insulin glargine, 1 Unit Dial, (TOUJEO) 300 UNIT/ML Solostar Pen INJECT 40 UNITS UNDER THE SKIN EVERY MORNING 10/07/19 10/06/20  [name], [name], MD  losartan (COZAAR) 50 MG tablet TAKE 1 TABLET BY MOUTH ONCE A DAY (TAKE WITH HCTZ) 10/07/19 10/06/20  [name], [name], MD  losartan-hydrochlorothiazide (HYZAAR) 50-12.5 MG tablet Take 1 tablet by mouth daily.  09/04/16   [provider]  methocarbamol (ROBAXIN) 500 MG tablet Take 1 tablet (500 mg total) by mouth 2 (two) times daily between meals as needed for muscle spasms. 12/09/18   Lorelee NewGreen, Garrett L, PA-C  naproxen (NAPROSYN) 500 MG tablet Take 1 tablet (500 mg total) by mouth 2 (two) times daily between meals as needed for moderate pain. 12/09/18   Lorelee NewGreen, Garrett L, PA-C  ondansetron (ZOFRAN) 4 MG tablet Take 1 tablet (4 mg total) by mouth every 6 (six) hours as needed for nausea or vomiting. 07/06/13   Dione BoozeGlick, David, MD  pravastatin (PRAVACHOL) 20 MG tablet Take 20 mg by mouth daily.    [provider]  pravastatin (PRAVACHOL) 20 MG tablet TAKE 1 TABLET BY MOUTH ONCE DAILY 10/07/19  10/06/20  Fleet Contras, MD  SYMBICORT 160-4.5 MCG/ACT inhaler Inhale 2 puffs into the lungs as needed (Shortness of breath).  01/11/17   [provider]  Topiramate ER (TROKENDI XR) 50 MG CP24 Take 1 capsule by mouth at bedtime. Patient not taking: Reported on 07/31/2017 04/17/17   Drema Dallas, DO      Allergies    Patient has no known allergies.    Review of Systems   Review of Systems  Constitutional:  Positive for chills and fever.  HENT:  Negative for rhinorrhea and sore throat.   Respiratory:  Positive for cough.   Gastrointestinal:  Positive for nausea and  vomiting. Negative for diarrhea.  Musculoskeletal:  Positive for myalgias.  Ten systems reviewed and are negative for acute change, except as noted in the HPI.    Physical Exam Updated Vital Signs BP 116/74    Pulse 95    Temp 99.2 F (37.3 C) (Oral)    Resp 18    Ht 5\' 5"  (1.651 m)    Wt 79.4 kg    LMP 05/22/2012    SpO2 91%    BMI 29.12 kg/m   Physical Exam Vitals and nursing note reviewed.  Constitutional:      General: She is not in acute distress.    Appearance: She is well-developed. She is ill-appearing. She is not diaphoretic.     Comments: Appears unwell, but nontoxic  HENT:     Head: Normocephalic and atraumatic.  Eyes:     General: No scleral icterus.    Conjunctiva/sclera: Conjunctivae normal.  Cardiovascular:     Rate and Rhythm: Regular rhythm. Tachycardia present.     Pulses: Normal pulses.  Pulmonary:     Effort: Pulmonary effort is normal. No respiratory distress.     Breath sounds: No stridor. No wheezing or rales.     Comments: Respirations even and unlabored.  Lungs clear bilaterally Abdominal:     Palpations: Abdomen is soft.     Tenderness: There is no abdominal tenderness.  Musculoskeletal:        General: Normal range of motion.     Cervical back: Normal range of motion.  Skin:    General: Skin is warm and dry.     Coloration: Skin is not pale.     Findings: No erythema or rash.  Neurological:     Mental Status: She is alert and oriented to person, place, and time.     Coordination: Coordination normal.     Comments: Moving all extremities spontaneously  Psychiatric:        Behavior: Behavior normal.    ED Results / Procedures / Treatments   Labs (all labs ordered are listed, but only abnormal results are displayed) Labs Reviewed  CBC WITH DIFFERENTIAL/PLATELET - Abnormal; Notable for the following components:      Result Value   RBC 5.40 (*)    HCT 47.1 (*)    All other components within normal limits  COMPREHENSIVE METABOLIC PANEL -  Abnormal; Notable for the following components:   Sodium 129 (*)    Chloride 92 (*)    CO2 18 (*)    Glucose, Bld 397 (*)    BUN 22 (*)    Creatinine, Ser 1.35 (*)    Calcium 8.8 (*)    Total Protein 8.2 (*)    Albumin 3.4 (*)    AST 49 (*)    Alkaline Phosphatase 129 (*)    GFR, Estimated 46 (*)  Anion gap 19 (*)    All other components within normal limits  CBG MONITORING, ED - Abnormal; Notable for the following components:   Glucose-Capillary 437 (*)    All other components within normal limits  BASIC METABOLIC PANEL  BLOOD GAS, VENOUS  CBG MONITORING, ED    EKG None  Radiology DG Chest 2 View  Result Date: 01/23/2021 CLINICAL DATA:  Productive cough with nausea, vomiting fever and chills. EXAM: CHEST - 2 VIEW COMPARISON:  March 03, 2017 FINDINGS: The heart size and mediastinal contours are within normal limits. Low lung volumes are noted with subsequent crowding of the bronchovascular lung markings. Mild atelectasis and/or early infiltrate is seen along the medial aspect of the left lung base. There is no evidence of a pleural effusion or pneumothorax. The visualized skeletal structures are unremarkable. IMPRESSION: Low lung volumes with mild left basilar atelectasis and/or early infiltrate. Electronically Signed   By: Aram Candelahaddeus  Houston M.D.   On: 01/23/2021 00:44    Procedures Procedures    Medications Ordered in ED Medications  acetaminophen (TYLENOL) tablet 650 mg (has no administration in time range)  0.9 %  sodium chloride infusion (has no administration in time range)  sodium chloride 0.9 % bolus 1,000 mL (0 mLs Intravenous Stopped 01/23/21 0304)  acetaminophen (TYLENOL) tablet 650 mg (650 mg Oral Given 01/23/21 0038)  sodium chloride 0.9 % bolus 500 mL (0 mLs Intravenous Stopped 01/23/21 0342)  insulin aspart (novoLOG) injection 8 Units (8 Units Subcutaneous Given 01/23/21 0442)    ED Course/ Medical Decision Making/ A&P Clinical Course as of 01/23/21 0701   Sun Jan 23, 2021  0317 RN tech reports patient desires to eat. Patient OK for PO at this time. [KH]  0320 Chest x-ray imaging reviewed by myself.  Suspect that findings largely are related to atelectasis due to poor inspiration. [KH]  0503 Sats at or above 90% with ambulation averaging ~93% on RA. [KH]  [id number] Patient has tolerated PO fluids, a sandwich, and multiple crackers. [KH]  [id number] Plan for recheck of CBG following administration of NovoLog.  We will also repeat BMP to ensure improvement to electrolytes and anion gap.  These changes are felt to be most related to dehydration from poor oral intake rather than DKA at this time.  Creatinine, while elevated, is largely unchanged compared to 2 years ago. [KH]    Clinical Course User Index [KH] Antony MaduraHumes, Author [place], PA-C                           Medical Decision Making  Patient's symptoms are consistent with viral etiology. Tested positive for COVID at home 4 days ago when symptoms began.  Oxygen saturations have been borderline low, though the patient has not been hypoxic and she is ambulatory in the department with saturations at or above 90%.  She has been started on incentive spirometry for pulmonary toilet, to ensure deep breathing.  Her chest x-ray is most suggestive of atelectatic changes related to viral illness.  Patient is a known diabetic, but has been noncompliant with her insulin for months due to lack of primary care follow-up.  Her CBG was noted to be around 400, though low suspicion for DKA at this time.  Instead, suspect degree of dehydration due to poor oral intake associated with nausea and vomiting which has coincided with her COVID infection.  She has been hydrated in the emergency department with IV fluids with improvement in her tachycardia.  Also given antipyretics.  Plan to repeat metabolic panel to ensure improvement to electrolytes and anion gap.  NovoLog given for hyperglycemia.  Case was discussed with pharmacy who recommends  treatment with Molnupiravir given comorbidities. Could, instead, consider Paxlovid, but would need to be on reduced dose of this and stop her statin while taking until 5 days post treatment. Monoclonal ABs no longer approved by FDA due to being ineffective against this variant.  Care signed out to [place], PA-C at shift change pending repeat labs.    Vitals:   01/23/21 0423 01/23/21 0424 01/23/21 0530 01/23/21 0630  BP: 124/79  116/74 103/83  Pulse: 80 93 95 (!) 102  Resp: 18  18 16   Temp:      TempSrc:      SpO2:  91% 91% 92%  Weight:      Height:               Final Clinical Impression(s) / ED Diagnoses Final diagnoses:  COVID-19 virus infection  Hyperglycemia  Dehydration    Rx / DC Orders ED Discharge Orders     None         , PA-C 01/23/21 0708    01/25/21, MD 01/24/21 [phone]

## 2021-01-23 NOTE — ED Provider Notes (Signed)
Care of patient assumed from [place] at [phone].  Agree with history, physical exam and plan.  See their note for further details. Briefly, past medical history of diabetes mellitus (noncompliant with medications since summer 2022), asthma, hypertension, obesity.  Tested positive for COVID-19 at home on Tuesday with symptom onset the same day.  Patient has no COVID-19 vaccination.  Physical Exam  BP 103/83    Pulse (!) 102    Temp 99.2 F (37.3 C) (Oral)    Resp 16    Ht 5\' 5"  (1.651 m)    Wt 79.4 kg    LMP 05/22/2012    SpO2 92%    BMI 29.12 kg/m   Physical Exam Vitals and nursing note reviewed.  Constitutional:      General: She is not in acute distress.    Appearance: She is ill-appearing. She is not toxic-appearing or diaphoretic.  HENT:     Head: Normocephalic.  Eyes:     General: No scleral icterus.       Right eye: No discharge.        Left eye: No discharge.  Cardiovascular:     Rate and Rhythm: Normal rate.  Pulmonary:     Effort: Pulmonary effort is normal. No tachypnea, bradypnea or respiratory distress.     Breath sounds: Normal breath sounds. No stridor.  Skin:    General: Skin is warm and dry.  Neurological:     General: No focal deficit present.     Mental Status: She is alert.  Psychiatric:        Behavior: Behavior is cooperative.    Procedures  Procedures  ED Course / MDM   Clinical Course as of 01/23/21 0706  Sun Jan 23, 2021  0317 RN tech reports patient desires to eat. Patient OK for PO at this time. [id number] Chest x-ray imaging reviewed by myself.  Suspect that findings largely are related to atelectasis due to poor inspiration. [KH]  0503 Sats at or above 90% with ambulation averaging ~93% on RA. [KH]  [id number] Patient has tolerated PO fluids, a sandwich, and multiple crackers. [KH]  [phone] Plan for recheck of CBG following administration of NovoLog.  We will also repeat BMP to ensure improvement to electrolytes and anion gap.  These changes are felt to  be most related to dehydration from poor oral intake rather than DKA at this time.  Creatinine, while elevated, is largely unchanged compared to 2 years ago. [KH]    Clinical Course User Index [KH] Antonietta Breach, PA-C   Medical Decision Making  Patient's initial lab work showed metabolic derangement.  Low suspicion for dehydration as cause of patient's metabolic derangement patient received fluid bolus.  Repeat lab work improved.  VBG shows no acidosis to suggest that patient is in DKA at this time.  On my assessment patient denies any chest pain or discomfort.  Oxygen saturations in the 99% with patient lying in left lateral position.  Repeat ambulation oxygen saturation increases to 94% on room air.  SPECT that patient's hypoxia was positional.  Due to patient's medical history she is a candidate for viral treatment.  Shared decision making with patient who is agreeable to starting Paxil ovate at this time.  Patient is no longer taking her statin medication.  We will give patient reduced dose of Paxil ovate due to AKI.  Patient reports that she has been out of her insulin since this MRI and has not seen a primary care provider in  2 years.  Will prescribe patient with refill on insulin medication.  Importance of follow-up with PCP for further medication management was stressed to the patient.  Patient does endorse that she has insurance and will work on setting up PCP appointment in outpatient setting.  Discussed results, findings, treatment and follow up. Patient advised of return precautions. Patient verbalized understanding and agreed with plan.        Loni Beckwith, PA-C 01/23/21 [id number]    Maudie Flakes, MD 01/24/21 [phone]

## 2021-01-23 NOTE — Discharge Instructions (Addendum)
You came to the emergency department today with reports of Covid-19 like symptoms.   You tested positive for COVID-19. Please isolate at home for at least 7 days after the day your symptoms initially began, and THEN at least 24 hours after you are fever-free without the help of medications (Tylenol/acetaminophen and Advil/ibuprofen/Motrin) AND your symptoms are improving.  You can alternate Tylenol/acetaminophen and Advil/ibuprofen/Motrin every 4 hours for sore throat, body aches, headache or fever.  Drink plenty of water.  Use saline nasal spray for congestion. You can take Tessalon every 8 hours as needed for cough. You can take Zofran every 8 hours as needed for nausea and vomiting. Wash your hands frequently. Please rest as needed with frequent repositioning and ambulation as tolerated.    If you use a CPAP or BiPAP device for management of obstructive sleep apnea may continue to use it however use it when isolated from other individuals to avoid spread of COVID-19.   If you use a nebulizer administer medication such as albuterol you may continue to use it however only one isolated from other individuals to avoid the spread of COVID-19.  If your symptoms do not improve please follow-up with your primary care provider or urgent care.  Return to the ER for significant shortness of breath, uncontrollable vomiting, severe chest pain, inability to tolerate fluids, changes in mental status such as confusion or other concerning symptoms.  While in the emergency department you are noted to have high blood sugar.  I have given you a refill of your insulin medication.  Please make sure to set up a appointment with a primary care provider for further management of your diabetes.

## 2024-02-11 ENCOUNTER — Other Ambulatory Visit (HOSPITAL_COMMUNITY): Payer: Self-pay
